# Patient Record
Sex: Female | Born: 1990 | Race: Black or African American | Hispanic: No | Marital: Single | Smoking: Never smoker
Health system: Southern US, Community
[De-identification: ages and names within clinical notes are randomized; demographics above are authoritative.]

## PROBLEM LIST (undated history)

## (undated) DIAGNOSIS — Z789 Other specified health status: Secondary | ICD-10-CM

## (undated) DIAGNOSIS — E559 Vitamin D deficiency, unspecified: Secondary | ICD-10-CM

## (undated) DIAGNOSIS — J039 Acute tonsillitis, unspecified: Secondary | ICD-10-CM

## (undated) DIAGNOSIS — E669 Obesity, unspecified: Secondary | ICD-10-CM

## (undated) DIAGNOSIS — Z803 Family history of malignant neoplasm of breast: Secondary | ICD-10-CM

## (undated) HISTORY — PX: NO PAST SURGERIES: SHX2092

## (undated) HISTORY — DX: Family history of malignant neoplasm of breast: Z80.3

## (undated) HISTORY — DX: Vitamin D deficiency, unspecified: E55.9

## (undated) HISTORY — DX: Obesity, unspecified: E66.9

## (undated) HISTORY — DX: Acute tonsillitis, unspecified: J03.90

---

## 2012-05-23 ENCOUNTER — Emergency Department: Payer: Self-pay | Admitting: Emergency Medicine

## 2012-05-23 LAB — PREGNANCY, URINE: Pregnancy Test, Urine: NEGATIVE m[IU]/mL
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
## 2014-09-25 ENCOUNTER — Inpatient Hospital Stay: Payer: Self-pay

## 2014-09-25 LAB — CBC WITH DIFFERENTIAL/PLATELET
Basophil #: 0.1 10*3/uL (ref 0.0–0.1)
Basophil %: 0.7 %
EOS ABS: 0.1 10*3/uL (ref 0.0–0.7)
Eosinophil %: 1.5 %
HCT: 38 % (ref 35.0–47.0)
HGB: 12.5 g/dL (ref 12.0–16.0)
Lymphocyte #: 1.9 10*3/uL (ref 1.0–3.6)
Lymphocyte %: 21.2 %
MCH: 26.1 pg (ref 26.0–34.0)
MCHC: 32.8 g/dL (ref 32.0–36.0)
MCV: 79 fL — ABNORMAL LOW (ref 80–100)
MONOS PCT: 4.9 %
Monocyte #: 0.4 x10 3/mm (ref 0.2–0.9)
NEUTROS ABS: 6.6 10*3/uL — AB (ref 1.4–6.5)
Neutrophil %: 71.7 %
PLATELETS: 231 10*3/uL (ref 150–440)
RBC: 4.79 10*6/uL (ref 3.80–5.20)
RDW: 13.8 % (ref 11.5–14.5)
WBC: 9.2 10*3/uL (ref 3.6–11.0)

## 2014-09-26 LAB — GC/CHLAMYDIA PROBE AMP

## 2014-09-27 LAB — HEMATOCRIT: HCT: 33.2 % — ABNORMAL LOW (ref 35.0–47.0)

## 2015-04-19 NOTE — H&P (Signed)
L&D Evaluation:  History Expanded:  HPI 24 yo G1 at 7686w6d gestational age by 173w4d gestational age.  Her pregnancy has been complicated by BMI 34 at initiation of care and diagnosis of trichomonas in March.  She presents with regular uterine contractions since 3pm today. She notes positive fetal movement, no leakage of fluid and no VB.   Gravida 1   Blood Type (Maternal) O positive   Group B Strep Results Maternal (Result >5wks must be treated as unknown) negative   Maternal HIV Negative   Maternal Syphilis Ab Nonreactive   Maternal Varicella Immune   Rubella Results (Maternal) immune   [facility]   Patient's Medical History No Chronic Illness  Obesity   Patient's Surgical History none   Medications Pre Natal Vitamins   Allergies NKDA   Social History none   Family History Non-Contributory   ROS:  ROS All systems were reviewed.  HEENT, CNS, GI, GU, Respiratory, CV, Renal and Musculoskeletal systems were found to be normal.   Exam:  Vital Signs T98.2, BPs 130s/80s, P70s, RR 18   General no apparent distress   Mental Status clear   Chest clear   Heart normal sinus rhythm   Abdomen gravid, non-tender   Estimated Fetal Weight 7 pounds   Fetal Position cephalic   Back no CVAT   Edema no edema   Pelvic 3.4 -> 5cm per RN   Mebranes Intact   FHT normal rate with no decels   FHT Description 130/mod var/+accels/no decels   Ucx not tracing well (3-4 q 10 min)   Skin no lesions   Lymph no lymphadenopathy   Impression:  Impression active labor, 1) Intrauterine pregnancy at 3786w6d gestational age, 2) labor, 3) obesity w/ BMI in 35   Plan:  Comments 1) Labor: expectant management  2) Fetus - category I tracing  3) PNL O positive / ABSC negative / RI / VZI / HIV neg / RPR NR / HBsAg neg / declines genetic screen / 1-hr OGTT 86 (early), 118 (28 weeks) / GBS negative  4) TDAP given 08/02/14  5) Disposition - home postpartum   Electronic  Signatures: Conard NovakJackson, Naveya Ellerman D (MD)  (Signed 17-Oct-15 20:26)  Authored: L&D Evaluation   Last Updated: 17-Oct-15 20:26 by Conard NovakJackson, Joylyn Duggin D (MD)

## 2017-03-28 ENCOUNTER — Ambulatory Visit: Payer: Self-pay | Admitting: Obstetrics and Gynecology

## 2017-11-30 ENCOUNTER — Emergency Department
Admission: EM | Admit: 2017-11-30 | Discharge: 2017-11-30 | Disposition: A | Discharge status: Home / Self Care | Payer: BLUE CROSS/BLUE SHIELD | Attending: Emergency Medicine | Admitting: Emergency Medicine

## 2017-11-30 ENCOUNTER — Encounter: Payer: Self-pay | Admitting: Emergency Medicine

## 2017-11-30 ENCOUNTER — Other Ambulatory Visit: Payer: Self-pay

## 2017-11-30 DIAGNOSIS — R220 Localized swelling, mass and lump, head: Secondary | ICD-10-CM | POA: Insufficient documentation

## 2017-11-30 DIAGNOSIS — Z3A01 Less than 8 weeks gestation of pregnancy: Secondary | ICD-10-CM | POA: Diagnosis not present

## 2017-11-30 DIAGNOSIS — K0500 Acute gingivitis, plaque induced: Secondary | ICD-10-CM | POA: Diagnosis not present

## 2017-11-30 DIAGNOSIS — O9989 Other specified diseases and conditions complicating pregnancy, childbirth and the puerperium: Secondary | ICD-10-CM | POA: Insufficient documentation

## 2017-11-30 LAB — CBC WITH DIFFERENTIAL/PLATELET
BASOS PCT: 1 %
Basophils Absolute: 0.1 10*3/uL (ref 0–0.1)
Eosinophils Absolute: 0.2 10*3/uL (ref 0–0.7)
Eosinophils Relative: 3 %
HCT: 40.7 % (ref 35.0–47.0)
Hemoglobin: 13.6 g/dL (ref 12.0–16.0)
LYMPHS ABS: 2.3 10*3/uL (ref 1.0–3.6)
Lymphocytes Relative: 28 %
MCH: 26.3 pg (ref 26.0–34.0)
MCHC: 33.4 g/dL (ref 32.0–36.0)
MCV: 78.9 fL — ABNORMAL LOW (ref 80.0–100.0)
Monocytes Absolute: 0.8 10*3/uL (ref 0.2–0.9)
Monocytes Relative: 11 %
NEUTROS PCT: 57 %
Neutro Abs: 4.6 10*3/uL (ref 1.4–6.5)
Platelets: 296 10*3/uL (ref 150–440)
RBC: 5.16 MIL/uL (ref 3.80–5.20)
RDW: 13.9 % (ref 11.5–14.5)
WBC: 8 10*3/uL (ref 3.6–11.0)

## 2017-11-30 MED ORDER — AMOXICILLIN 400 MG/5ML PO SUSR: 800.0000 mg | Freq: Two times a day (BID) | ORAL | 0 refills | Status: DC

## 2017-11-30 NOTE — ED Notes (Signed)

## 2017-11-30 NOTE — ED Provider Notes (Signed)
[facility] Emergency Department Provider Note  ____________________________________________   First MD Initiated Contact with Patient 11/30/17 1441     (approximate)  I have reviewed the triage vital signs and the nursing notes.   HISTORY  Chief Complaint Lymphadenopathy   HPI Michelle Bates is a 26 y.o. female is here with complaint of swelling inside her mouth for the last 4 days. Patient is unaware of any fever or chills. She denies any nausea or vomiting. Patient states she has had problems with her teeth in the past. Currently she is [redacted] weeks pregnant. She denies any issues with her pregnancy.she rates her discomfort as 6 out of 10.   History reviewed. No pertinent past medical history.  There are no active problems to display for this patient.   History reviewed. No pertinent surgical history.  Prior to Admission medications   Medication Sig Start Date End Date Taking? Authorizing Provider  amoxicillin (AMOXIL) 400 MG/5ML suspension Take 10 mLs (800 mg total) by mouth 2 (two) times daily. 11/30/17   Tommi RumpsSummers, Fabian Walder L, PA-C    Allergies Patient has no known allergies.  No family history on file.  Social History Social History   Tobacco Use  . Smoking status: Not on file  Substance Use Topics  . Alcohol use: Not on file  . Drug use: Not on file    Review of Systems Constitutional: No fever/chills Eyes: No visual changes. ENT: positive mouth pain. Positive dental pain. Cardiovascular: Denies chest pain. Respiratory: Denies shortness of breath. Gastrointestinal: No abdominal pain.  No nausea, no vomiting.  No diarrhea.   Musculoskeletal: Negative for back pain. Neurological: Negative for headaches, focal weakness or numbness. ____________________________________________   PHYSICAL EXAM:  VITAL SIGNS: ED Triage Vitals [11/30/17 1342]  Enc Vitals Group     BP 106/70     Pulse Rate 90     Resp 20     Temp 99 F (37.2 C)     Temp Source Oral     SpO2 100 %     Weight 200 lb (90.7 kg)     Height 5\' 2"  (1.575 m)     Head Circumference      Peak Flow      Pain Score 6     Pain Loc      Pain Edu?      Excl. in GC?    Constitutional: Alert and oriented. Well appearing and in no acute distress. Eyes: Conjunctivae are normal.  Head: Atraumatic. Nose: No congestion/rhinnorhea. Mouth/Throat: Mucous membranes are moist.  Oropharynx non-erythematous. Bilateral upper molars and premolars are tender to palpation. There is some irritation of the hard palate. No lesions are noted under the tongue. Uvula is midline. No erythema or exudate is noted. There is no obvious abscess or drainage noted. Neck: No stridor.   Hematological/Lymphatic/Immunilogical: No cervical lymphadenopathy. Cardiovascular: Normal rate, regular rhythm. Grossly normal heart sounds.  Good peripheral circulation. Respiratory: Normal respiratory effort.  No retractions. Lungs CTAB. Musculoskeletal: moves upper and lower extremities without any difficulty. Normal gait was noted. Neurologic:  Normal speech and language. No gross focal neurologic deficits are appreciated. No gait instability. Skin:  Skin is warm, dry and intact. No rash noted. Psychiatric: Mood and affect are normal. Speech and behavior are normal.  ____________________________________________   LABS (all labs ordered are listed, but only abnormal results are displayed)  Labs Reviewed  CBC WITH DIFFERENTIAL/PLATELET - Abnormal; Notable for the following components:  Result Value   MCV 78.9 (*)    All other components within normal limits    ____________________________________________   PROCEDURES  Procedure(s) performed: None  Procedures  Critical Care performed: No  ____________________________________________   INITIAL IMPRESSION / ASSESSMENT AND PLAN / ED COURSE Patient was given amoxicillin 400 mg 2 teaspoons twice a day as she states she prefers liquid.  Patient will continue with liquids and soft foods at this time. She will contact her dentist and OB/GYN if any continued problems. ____________________________________________   FINAL CLINICAL IMPRESSION(S) / ED DIAGNOSES  Final diagnoses:  Gingivitis, acute     ED Discharge Orders        Ordered    amoxicillin (AMOXIL) 400 MG/5ML suspension  2 times daily     11/30/17 1529       Note:  This document was prepared using Dragon voice recognition software and may include unintentional dictation errors.    Tommi RumpsSummers, Levia Waltermire L, PA-C 11/30/17 1540    Schaevitz, Myra Rudeavid Matthew, MD 12/01/17 1435

## 2017-11-30 NOTE — Discharge Instructions (Signed)
Eat soft foods and increase fluids.  Begin taking antibiotic twice a day as directed. Avoid salty or spicy foods at this time. Return to the emergency department if any severe worsening of her symptoms.

## 2017-11-30 NOTE — ED Triage Notes (Addendum)
Submandibular gland swelling noted x 4 days. States is [redacted] weeks pregnant with no symptoms related to pregnancy.

## 2017-12-10 NOTE — L&D Delivery Note (Addendum)
Date of delivery: 12/03/2018 Estimated Date of Delivery: 12/03/18 Patient's last menstrual period was 02/26/2018 (lmp unknown). EGA: 10230w0d  Delivery Note At 3:37 PM a viable female was delivered via Vaginal, Spontaneous (Presentation: OA;  LOA).   APGAR: 8, 9; Weight: 2960 g 6 pounds 8 ounces     Placenta status: spontaneous, intact.  Cord:  with the following complications: none.  Cord pH: NA  Mom pushed to deliver a viable female infant.  The head followed by tight shoulders, anterior shoulder delivered first after manipulation of posterior shoulder, and the rest of the body using gentle traction.  No nuchal cord noted.  Baby to mom's chest.  Cord clamped and cut after 3 min delay.  No cord blood obtained.  Placenta delivered spontaneously, intact, with a 3-vessel cord.  Perineum intact, no other lacerations.  All counts correct.  Hemostasis obtained with IV pitocin and fundal massage.     Anesthesia: epidural  Episiotomy: none Lacerations: none  Suture Repair: NA Est. Blood Loss (mL): 175  Mom to postpartum.  Baby to Couplet care / Skin to Skin.  Baby girl Dionisio PaschalOlivia Londyn Breastfeeding  Tresea MallJane Crislyn Willbanks, CNM 12/03/2018, 4:23 PM

## 2017-12-13 ENCOUNTER — Encounter: Payer: Self-pay | Admitting: Obstetrics and Gynecology

## 2017-12-13 ENCOUNTER — Ambulatory Visit (INDEPENDENT_AMBULATORY_CARE_PROVIDER_SITE_OTHER): Payer: BLUE CROSS/BLUE SHIELD | Admitting: Obstetrics and Gynecology

## 2017-12-13 VITALS — BP 112/58 | Ht 64.0 in | Wt 204.0 lb

## 2017-12-13 DIAGNOSIS — O99210 Obesity complicating pregnancy, unspecified trimester: Secondary | ICD-10-CM | POA: Insufficient documentation

## 2017-12-13 DIAGNOSIS — N926 Irregular menstruation, unspecified: Secondary | ICD-10-CM

## 2017-12-13 DIAGNOSIS — O0991 Supervision of high risk pregnancy, unspecified, first trimester: Secondary | ICD-10-CM

## 2017-12-13 DIAGNOSIS — O219 Vomiting of pregnancy, unspecified: Secondary | ICD-10-CM | POA: Insufficient documentation

## 2017-12-13 MED ORDER — DOXYLAMINE SUCCINATE (SLEEP) 25 MG PO TABS: 25.0000 mg | ORAL_TABLET | Freq: Every day | ORAL | 2 refills | Status: DC

## 2017-12-13 MED ORDER — PYRIDOXINE HCL 25 MG PO TABS: 25.0000 mg | ORAL_TABLET | Freq: Four times a day (QID) | ORAL | 3 refills | Status: DC

## 2017-12-13 NOTE — Progress Notes (Signed)
12/13/2017   Chief Complaint: Missed period  Transfer of Care Patient: no  History of Present Illness: Michelle Bates is a 27 y.o. G2P1001 [redacted]w[redacted]d based on Patient's last menstrual period was 10/14/2017 (exact date). with an Estimated Date of Delivery: 07/21/18, with the above CC.   NOB today. Pt c/o soreness in vaginal area and lower abdominal cramping/pain. Also c/o nausea, no vomiting. Positive home UPT.   Patient desires to have a tubal ligation this pregnancy, discussed with the patient that most people younger than 30 at the time of a tubal regret the decision. Asked her to consider if she would want another child if she was in a different relationship in the future, or if one of her children passed away would she want another.  Discussed option for salpingectomy.   Her periods were: regular periods every 28 days She was using no method when she conceived.  She has Negative signs or symptoms of nausea/vomiting of pregnancy. She has Negative signs or symptoms of miscarriage or preterm labor She identifies Negative Zika risk factors for her and her partner On any different medications around the time she conceived/early pregnancy: No  History of varicella: No   ROS: A 12-point review of systems was performed and negative, except as stated in the above HPI.  OBGYN History: As per HPI. OB History  Gravida Para Term Preterm AB Living  2 1 1     1   SAB TAB Ectopic Multiple Live Births          1    # Outcome Date GA Lbr Len/2nd Weight Sex Delivery Anes PTL Lv  2 Current           1 Term 09/26/14   6 lb 4 oz (2.835 kg) M Vag-Spont   LIV      Any issues with any prior pregnancies: no Any prior children are healthy, doing well, without any problems or issues: yes History of pap smears: Yes. Last pap smear 2015, NIL History of STIs: Yes, but unsure what.   Past Medical History: Past Medical History:  Diagnosis Date  . Obesity   . Tonsillitis     Past Surgical History: Past  Surgical History:  Procedure Laterality Date  . NO PAST SURGERIES      Family History:  Family History  Problem Relation Age of Onset  . Colon cancer Mother 75  . Brain cancer Father 36  . Breast cancer Maternal Aunt 40   She denies bleeding or blood clotting disorders.  She denies any history of mental retardation, birth defects or genetic disorders in her or the FOB's history  Social History:  Social History   Socioeconomic History  . Marital status: Single    Spouse name: Not on file  . Number of children: Not on file  . Years of education: Not on file  . Highest education level: Not on file  Social Needs  . Financial resource strain: Not on file  . Food insecurity - worry: Not on file  . Food insecurity - inability: Not on file  . Transportation needs - medical: Not on file  . Transportation needs - non-medical: Not on file  Occupational History  . Not on file  Tobacco Use  . Smoking status: Never Smoker  . Smokeless tobacco: Never Used  Substance and Sexual Activity  . Alcohol use: No    Frequency: Never  . Drug use: No  . Sexual activity: Yes    Birth control/protection: None  Other Topics Concern  . Not on file  Social History Narrative  . Not on file   Any pets in the household: no  Allergy: No Known Allergies  Current Outpatient Medications:  Current Outpatient Medications:  .  doxylamine, Sleep, (UNISOM) 25 MG tablet, Take 1 tablet (25 mg total) by mouth at bedtime., Disp: 30 tablet, Rfl: 2 .  pyridOXINE (VITAMIN B-6) 25 MG tablet, Take 1 tablet (25 mg total) by mouth QID., Disp: 120 tablet, Rfl: 3   Physical Exam:   BP (!) 112/58   Ht 5\' 4"  (1.626 m)   Wt 204 lb (92.5 kg)   LMP 10/14/2017 (Exact Date)   BMI 35.02 kg/m  Body mass index is 35.02 kg/m. Constitutional: Well nourished, well developed female in no acute distress.  Neck:  Supple, normal appearance, and no thyromegaly  Cardiovascular: S1, S2 normal, no murmur, rub or gallop,  regular rate and rhythm Respiratory:  Clear to auscultation bilateral. Normal respiratory effort Abdomen: positive bowel sounds and no masses, hernias; diffusely non tender to palpation, non distended Breasts: breasts appear normal, no suspicious masses, no skin or nipple changes or axillary nodes. Neuro/Psych:  Normal mood and affect.  Skin:  Warm and dry.  Lymphatic:  No inguinal lymphadenopathy.   Pelvic exam: is limited by body habitus EGBUS: within normal limits, Vagina: within normal limits and with no blood in the vault, Cervix: normal appearing cervix without discharge or lesions, closed/long/high, Uterus:  nonenlarged, and Adnexa:  normal adnexa and no mass, fullness, tenderness  Bedside US showed Gestational Sac in uterus, no clear yolk sac or fetal pole.   Assessment: Michelle Bates is a 27 y.o. G2P1001 2488w4d based on Patient's last menstrual period was 10/14/2017 (exact date). with an Estimated Date of Delivery: 07/21/18,  for prenatal care.  Plan:  1) Avoid alcoholic beverages. 2) Patient encouraged not to smoke.  3) Discontinue the use of all non-medicinal drugs and chemicals.  4) Take prenatal vitamins daily.  5) Seatbelt use advised 6) Nutrition, food safety (fish, cheese advisories, and high nitrite foods) and exercise discussed. 7) Hospital and [facility] discussed  8) Patient is asked about travel to areas at risk for the Zika virus, and counseled to avoid travel and exposure to mosquitoes or sexual partners who may have themselves been exposed to the virus. Testing is discussed, and will be ordered as appropriate.  9) [facility] advised 10) Genetic Screening, such as with 1st Trimester Screening, cell free fetal DNA, AFP testing, and Ultrasound, as well as with amniocentesis and CVS as appropriate, is discussed with patient. She plans to have genetic testing this pregnancy. Sickle cell screen in last pregnancy was negative. 11) Pap  today 12) Follow up for dating US in 2 weeks 13) B6 and unisom prescribed for nausea 14) Declined flu vaccination 15) offered patient genetic screening because of family history, she excepted. Labs sent. 16) Problem list reviewed and updated.   Adelene Idlerhristanna Yahaira Bruski MD [facility], [facility] 12/13/2017  6:35 PM

## 2017-12-14 LAB — RPR+RH+ABO+RUB AB+AB SCR+CB...
ANTIBODY SCREEN: NEGATIVE
HIV Screen 4th Generation wRfx: NONREACTIVE
Hematocrit: 35.6 % (ref 34.0–46.6)
Hemoglobin: 11.6 g/dL (ref 11.1–15.9)
Hepatitis B Surface Ag: NEGATIVE
MCH: 25.6 pg — AB (ref 26.6–33.0)
MCHC: 32.6 g/dL (ref 31.5–35.7)
MCV: 78 fL — ABNORMAL LOW (ref 79–97)
PLATELETS: 318 10*3/uL (ref 150–379)
RBC: 4.54 x10E6/uL (ref 3.77–5.28)
RDW: 14.6 % (ref 12.3–15.4)
RPR Ser Ql: NONREACTIVE
Rh Factor: POSITIVE
Rubella Antibodies, IGG: 2.26 index (ref >0.99)
Varicella zoster IgG: 442 index (ref >165)
WBC: 6.7 10*3/uL (ref 3.4–10.8)

## 2017-12-14 LAB — URINE CULTURE

## 2017-12-14 LAB — URINE DRUG PANEL 7
Amphetamines, Urine: NEGATIVE ng/mL
BENZODIAZEPINE QUANT UR: NEGATIVE ng/mL
Barbiturate Quant, Ur: NEGATIVE ng/mL
Cannabinoid Quant, Ur: NEGATIVE ng/mL
Cocaine (Metab.): NEGATIVE ng/mL
Opiate Quant, Ur: NEGATIVE ng/mL
PCP QUANT UR: NEGATIVE ng/mL

## 2017-12-18 LAB — PAPIG, CTNGTV, HPV, RFX 16/18
CHLAMYDIA, NUC. ACID AMP: NEGATIVE
Gonococcus, Nuc. Acid Amp: NEGATIVE
HPV, HIGH-RISK: NEGATIVE
PAP Smear Comment: 0
Trich vag by NAA: POSITIVE — AB

## 2017-12-24 ENCOUNTER — Encounter: Payer: Self-pay | Admitting: Obstetrics and Gynecology

## 2017-12-24 ENCOUNTER — Other Ambulatory Visit (INDEPENDENT_AMBULATORY_CARE_PROVIDER_SITE_OTHER): Payer: BLUE CROSS/BLUE SHIELD

## 2017-12-24 ENCOUNTER — Ambulatory Visit (INDEPENDENT_AMBULATORY_CARE_PROVIDER_SITE_OTHER): Payer: BLUE CROSS/BLUE SHIELD | Admitting: Obstetrics and Gynecology

## 2017-12-24 ENCOUNTER — Encounter: Payer: BLUE CROSS/BLUE SHIELD | Admitting: Maternal Newborn

## 2017-12-24 VITALS — BP 118/74 | Wt 208.0 lb

## 2017-12-24 DIAGNOSIS — O200 Threatened abortion: Secondary | ICD-10-CM | POA: Diagnosis not present

## 2017-12-24 NOTE — Progress Notes (Signed)
Obstetrics & Gynecology Office Visit   Chief Complaint  Patient presents with  . Vaginal Bleeding    in early pregnancy   History of Present Illness: 27 y.o. G2P1001 female at [redacted]w[redacted]d (unconfirmed and likely to be earlier). She states that she has been spotting during the month of January. However, yesterday the spotting became heavier to where she noted it at a time different than when she was going to the bathroom (normally just a spot on the tissue when she wiped while voiding).  She has mild cramping. She is scheduled for an ultrasound on 12/30/17 for dating/viability.   Her last bedside u/s was on 1/4. No inciting events. Denies fevers, chills, abdominal pain.   Blood type is O+.    Past Medical History:  Diagnosis Date  . Obesity   . Tonsillitis     Past Surgical History:  Procedure Laterality Date  . NO PAST SURGERIES      Gynecologic History: Patient's last menstrual period was 10/14/2017 (exact date).  Obstetric History: G2P1001  Family History  Problem Relation Age of Onset  . Colon cancer Mother 20  . Brain cancer Father 57  . Breast cancer Maternal Aunt 17    Social History   Socioeconomic History  . Marital status: Single    Spouse name: Not on file  . Number of children: Not on file  . Years of education: Not on file  . Highest education level: Not on file  Social Needs  . Financial resource strain: Not on file  . Food insecurity - worry: Not on file  . Food insecurity - inability: Not on file  . Transportation needs - medical: Not on file  . Transportation needs - non-medical: Not on file  Occupational History  . Not on file  Tobacco Use  . Smoking status: Never Smoker  . Smokeless tobacco: Never Used  Substance and Sexual Activity  . Alcohol use: No    Frequency: Never  . Drug use: No  . Sexual activity: Yes    Birth control/protection: None  Other Topics Concern  . Not on file  Social History Narrative  . Not on file    No Known  Allergies  Prior to Admission medications   Medication Sig Start Date End Date Taking? Authorizing Provider  doxylamine, Sleep, (UNISOM) 25 MG tablet Take 1 tablet (25 mg total) by mouth at bedtime. 12/13/17   Natale Milch, MD  pyridOXINE (VITAMIN B-6) 25 MG tablet Take 1 tablet (25 mg total) by mouth QID. 12/13/17   Schuman, Jaquelyn Bitter, MD    Review of Systems  Constitutional: Negative.   HENT: Negative.   Eyes: Negative.   Respiratory: Negative.   Cardiovascular: Negative.   Gastrointestinal: Negative.   Genitourinary: Negative.        See HPI for gyn-related ROS  Musculoskeletal: Negative.   Skin: Negative.   Neurological: Negative.   Psychiatric/Behavioral: Negative.      Physical Exam BP 118/74   Wt 208 lb (94.3 kg)   LMP 10/14/2017 (Exact Date)   BMI 35.70 kg/m  Patient's last menstrual period was 10/14/2017 (exact date). Physical Exam  Constitutional: She is oriented to person, place, and time. She appears well-developed and well-nourished. No distress.  Genitourinary: Uterus normal. Pelvic exam was performed with patient supine. There is no rash, tenderness, lesion or injury on the right labia. There is no rash, tenderness, lesion or injury on the left labia. There is bleeding (trace amount of dark red blood in  vaginal vault that is coming from the cervix. the OS is closed) in the vagina. No erythema or tenderness in the vagina. No signs of injury around the vagina. No vaginal discharge found. Right adnexum does not display mass, does not display tenderness and does not display fullness. Left adnexum does not display mass, does not display tenderness and does not display fullness. Cervix does not exhibit motion tenderness, lesion, discharge or polyp.   Uterus is mobile and anteverted. Uterus is not enlarged, tender or exhibiting a mass.  HENT:  Head: Normocephalic and atraumatic.  Eyes: EOM are normal. No scleral icterus.  Neck: Normal range of motion. Neck supple.   Cardiovascular: Normal rate and regular rhythm. Exam reveals no gallop and no friction rub.  No murmur heard. Pulmonary/Chest: Effort normal and breath sounds normal. No respiratory distress. She has no wheezes. She has no rales. Right breast exhibits no inverted nipple, no mass, no nipple discharge, no skin change and no tenderness. Left breast exhibits no inverted nipple, no mass, no nipple discharge, no skin change and no tenderness.  Abdominal: Soft. Bowel sounds are normal. She exhibits no distension and no mass. There is no tenderness. There is no rebound and no guarding.  Musculoskeletal: Normal range of motion. She exhibits no edema or tenderness.  Lymphadenopathy:       Right: No inguinal adenopathy present.       Left: No inguinal adenopathy present.  Neurological: She is alert and oriented to person, place, and time. No cranial nerve deficit.  Skin: Skin is warm and dry. No rash noted. No erythema.  Psychiatric: She has a normal mood and affect. Her behavior is normal. Judgment normal.    Female chaperone present for pelvic and breast  portions of the physical exam  U/S shows single IUP with likely fetal pole with measurements consistent with 5918w6d GA, no cardiac activity noted.  No SCH. See u/s report for details. I personally reviewed the ultrasound images and agree with the filed report.  Assessment: 27 y.o. 1052P1001 female here for  1. Threatened abortion      Plan: Problem List Items Addressed This Visit      Other   Threatened abortion - Primary   Relevant Orders   US OB Transvaginal    reassured the patient overall today, though the timing of what has been seen on ultrasound is drastically different than the timing suggested by her pregnancy tests at home (first positive was early December). However, a bed side u/s 11 days ago showed an empty gestational sac. Today there is a fetal pole without an obvious yolk sac. Per ACOG PB 200, after 14 days we should see fetal  cardiac activity. So, will await her appt on 1/21 for confirmation.  I did discuss that this is possibly a miscarriage. However, she may have a normally-developing pregnancy.  Bleeding precautions given (>2 pads per hour for 2 hours).   Thomasene MohairStephen Blakelee Allington, MD 12/24/2017 12:36 PM

## 2017-12-26 ENCOUNTER — Ambulatory Visit (INDEPENDENT_AMBULATORY_CARE_PROVIDER_SITE_OTHER): Payer: BLUE CROSS/BLUE SHIELD | Admitting: Obstetrics & Gynecology

## 2017-12-26 VITALS — BP 118/80 | Wt 208.0 lb

## 2017-12-26 DIAGNOSIS — O209 Hemorrhage in early pregnancy, unspecified: Secondary | ICD-10-CM | POA: Diagnosis not present

## 2017-12-26 NOTE — Progress Notes (Signed)
Obstetric Problem Visit   Chief Complaint:  Chief Complaint  Patient presents with  . Vaginal Bleeding   History of Present Illness: Patient is a 27 y.o. G2P1001 [redacted]w[redacted]d presenting for first trimester bleeding.  The onset of bleeding was worse yesterday but has been bleeding irreg and more like spotting for two weeks.  [place] 2 days ago shoed IUP CRL 6 weeks no FHT yet. Mild hip pain, no radiation.  No modifiers or other assoc sx's. Rh status: O+  PMHx: She  has a past medical history of Obesity and Tonsillitis. Also,  has a past surgical history that includes No past surgeries., family history includes Brain cancer (age of onset: 2) in her father; Breast cancer (age of onset: 36) in her maternal aunt; Colon cancer (age of onset: 21) in her mother.,  reports that  has never smoked. she has never used smokeless tobacco. She reports that she does not drink alcohol or use drugs.  She has a current medication list which includes the following prescription(s): doxylamine (sleep) and pyridoxine. Also, has No Known Allergies.  Review of Systems  Constitutional: Negative for chills, fever and malaise/fatigue.  HENT: Negative for congestion, sinus pain and sore throat.   Eyes: Negative for blurred vision and pain.  Respiratory: Negative for cough and wheezing.   Cardiovascular: Negative for chest pain and leg swelling.  Gastrointestinal: Negative for abdominal pain, constipation, diarrhea, heartburn, nausea and vomiting.  Genitourinary: Negative for dysuria, frequency, hematuria and urgency.  Musculoskeletal: Negative for back pain, joint pain, myalgias and neck pain.  Skin: Negative for itching and rash.  Neurological: Negative for dizziness, tremors and weakness.  Endo/Heme/Allergies: Does not bruise/bleed easily.  Psychiatric/Behavioral: Negative for depression. The patient is not nervous/anxious and does not have insomnia.    Objective: Vitals:   12/26/17 0820  BP: 118/80   Physical Exam    Constitutional: She is oriented to person, place, and time. She appears well-developed and well-nourished. No distress.  Genitourinary: Vagina normal and uterus normal. Pelvic exam was performed with patient supine. There is no rash, tenderness or lesion on the right labia. There is no rash, tenderness or lesion on the left labia. No erythema or bleeding in the vagina. Right adnexum does not display mass and does not display tenderness. Left adnexum does not display mass and does not display tenderness. Cervix does not exhibit motion tenderness, discharge, polyp or nabothian cyst.   Uterus is mobile and midaxial. Uterus is not enlarged or exhibiting a mass.  Abdominal: Soft. She exhibits no distension. There is no tenderness.  Musculoskeletal: Normal range of motion.  Neurological: She is alert and oriented to person, place, and time. No cranial nerve deficit.  Skin: Skin is warm and dry.  Psychiatric: She has a normal mood and affect.    Assessment: 27 y.o. G2P1001 [redacted]w[redacted]d 1. First trimester bleeding  1) First trimester bleeding - incidence and clinical course of first trimester bleeding is discussed in detail with the patient today.  Approximately 1/3 of pregnancies ending in live births experienced 1st trimester bleeding.  The amount of bleeding is variable and not necessarily predictive of outcome.  Sources may be cervical or uterine.  Subchorionic hemorrhages are a frequent concurrent findings on ultrasound and are followed expectantly.  These often absorb or regress spontaneously although risk for expansion and further disruption of the utero-placental interface leading to miscarriage is possible.  There is no clearly documented benefit to limiting or modifying activity and sexual intercourse in altering clinic course  of 1st trimester bleeding.    2) If not already done will proceed with TVUS evaluation to document viability, and if uncertain viability or absence of a demonstrable IUP (and no  previous documentation of IUP) will trend HCG levels.  3) The patient is Rh POS, rhogam is therefore not indicated to decrease the risk rhesus alloimmunization.    4) Routine bleeding precautions were discussed with the patient prior the conclusion of today's visit.  Annamarie MajorPaul Grazia Taffe, MD, Merlinda FrederickFACOG [facility], [facility] 12/26/2017  8:55 AM

## 2017-12-27 ENCOUNTER — Other Ambulatory Visit: Payer: Self-pay | Admitting: Obstetrics and Gynecology

## 2017-12-27 ENCOUNTER — Other Ambulatory Visit: Payer: Self-pay | Admitting: Obstetrics & Gynecology

## 2017-12-27 MED ORDER — METRONIDAZOLE 500 MG PO TABS: 500.0000 mg | ORAL_TABLET | Freq: Two times a day (BID) | ORAL | 0 refills | Status: AC

## 2017-12-27 NOTE — Progress Notes (Signed)
Attempted to call but no answer. Prescription for flagyl sent. Released to mychart.

## 2017-12-27 NOTE — Progress Notes (Signed)
Can you call this patient for me as well? She has trich, didn't answer my call. I sent prescription and a note to her mychart. Thank you!!

## 2017-12-30 ENCOUNTER — Ambulatory Visit (INDEPENDENT_AMBULATORY_CARE_PROVIDER_SITE_OTHER): Payer: BLUE CROSS/BLUE SHIELD | Admitting: Obstetrics and Gynecology

## 2017-12-30 ENCOUNTER — Ambulatory Visit (INDEPENDENT_AMBULATORY_CARE_PROVIDER_SITE_OTHER): Payer: BLUE CROSS/BLUE SHIELD

## 2017-12-30 ENCOUNTER — Encounter: Payer: Self-pay | Admitting: Obstetrics and Gynecology

## 2017-12-30 ENCOUNTER — Other Ambulatory Visit: Payer: Self-pay | Admitting: Obstetrics and Gynecology

## 2017-12-30 VITALS — BP 114/78 | Wt 206.0 lb

## 2017-12-30 DIAGNOSIS — Z362 Encounter for other antenatal screening follow-up: Secondary | ICD-10-CM | POA: Diagnosis not present

## 2017-12-30 DIAGNOSIS — O039 Complete or unspecified spontaneous abortion without complication: Secondary | ICD-10-CM

## 2017-12-30 DIAGNOSIS — O0991 Supervision of high risk pregnancy, unspecified, first trimester: Secondary | ICD-10-CM

## 2017-12-30 NOTE — Progress Notes (Signed)
Gynecology Ultrasound Follow Up  Chief Complaint: follow up for viability and dating of pregnancy   History of Present Illness: Patient is a 27 y.o. female who presents today for ultrasound evaluation of the above .  I have personally reviewed the images and report for this ultrasound and my interpretation is reflected below.  Ultrasound demonstrates the following findings Adnexa: no masses seen  Uterus: anterverted with endometrial stripe  Normal and thin Additional: no evidence of pregnancy.  Completed SAB  Denies bleeding and cramping  Past Medical History:  Diagnosis Date  . Obesity   . Tonsillitis     Past Surgical History:  Procedure Laterality Date  . NO PAST SURGERIES       Family History  Problem Relation Age of Onset  . Colon cancer Mother 15  . Brain cancer Father 39  . Breast cancer Maternal Aunt 15    Social History   Socioeconomic History  . Marital status: Single    Spouse name: Not on file  . Number of children: Not on file  . Years of education: Not on file  . Highest education level: Not on file  Social Needs  . Financial resource strain: Not on file  . Food insecurity - worry: Not on file  . Food insecurity - inability: Not on file  . Transportation needs - medical: Not on file  . Transportation needs - non-medical: Not on file  Occupational History  . Not on file  Tobacco Use  . Smoking status: Never Smoker  . Smokeless tobacco: Never Used  Substance and Sexual Activity  . Alcohol use: No    Frequency: Never  . Drug use: No  . Sexual activity: Yes    Birth control/protection: None  Other Topics Concern  . Not on file  Social History Narrative  . Not on file    No Known Allergies  Prior to Admission medications   Medication Sig Start Date End Date Taking? Authorizing Provider  doxylamine, Sleep, (UNISOM) 25 MG tablet Take 1 tablet (25 mg total) by mouth at bedtime. 12/13/17   Schuman, Jaquelyn Bitter, MD  metroNIDAZOLE (FLAGYL)  500 MG tablet Take 1 tablet (500 mg total) by mouth 2 (two) times daily for 7 days. 12/27/17 01/03/18  Natale Milch, MD  pyridOXINE (VITAMIN B-6) 25 MG tablet Take 1 tablet (25 mg total) by mouth QID. 12/13/17   Natale Milch, MD    Physical Exam BP 114/78   Wt 206 lb (93.4 kg)   LMP 10/14/2017 (Exact Date)   Breastfeeding? Unknown   BMI 35.36 kg/m    General: NAD HEENT: normocephalic, anicteric Pulmonary: No increased work of breathing Extremities: no edema, erythema, or tenderness Neurologic: Grossly intact, normal gait Psychiatric: mood appropriate, affect full   Assessment: 27 y.o. G2P1011 here with  1. Spontaneous abortion     Plan: Problem List Items Addressed This Visit    None    Visit Diagnoses    Spontaneous abortion    -  Primary     Counseled patient on diagnosis of completed SAB. Discussed that causes are generally unknown, but likely to be unrelated to anything she did.  Counseled her to wait a cycle or so, but no need other than for emotional reasons to delay another attempt at pregnancy. She plans to try again soon.   15 minutes spent in face to face discussion with > 50% spent in counseling, management, and coordination of care of her spontaneous abortion.   Jeannett Senior  Jean RosenthalJackson, MD 12/30/2017 9:27 AM

## 2018-05-12 ENCOUNTER — Ambulatory Visit (INDEPENDENT_AMBULATORY_CARE_PROVIDER_SITE_OTHER): Payer: Managed Care, Other (non HMO) | Admitting: Obstetrics and Gynecology

## 2018-05-12 ENCOUNTER — Encounter: Payer: Self-pay | Admitting: Obstetrics and Gynecology

## 2018-05-12 VITALS — BP 112/72 | HR 74 | Wt 201.0 lb

## 2018-05-12 DIAGNOSIS — Z3A10 10 weeks gestation of pregnancy: Secondary | ICD-10-CM

## 2018-05-12 DIAGNOSIS — Z3481 Encounter for supervision of other normal pregnancy, first trimester: Secondary | ICD-10-CM

## 2018-05-12 DIAGNOSIS — Z3201 Encounter for pregnancy test, result positive: Secondary | ICD-10-CM | POA: Diagnosis not present

## 2018-05-12 DIAGNOSIS — Z3480 Encounter for supervision of other normal pregnancy, unspecified trimester: Secondary | ICD-10-CM

## 2018-05-12 DIAGNOSIS — N926 Irregular menstruation, unspecified: Secondary | ICD-10-CM

## 2018-05-12 LAB — POCT URINE PREGNANCY: PREG TEST UR: POSITIVE — AB

## 2018-05-12 NOTE — Progress Notes (Signed)
NOB today. +UPT at home. Pt reports no problems. Recent miscarriage, February 2019. Denies n/v.

## 2018-05-12 NOTE — Progress Notes (Signed)
05/12/2018   Chief Complaint: Missed period  Transfer of Care Patient: no  History of Present Illness: Ms. Curet is a 27 y.o. G3P1011 [redacted]w[redacted]d based on Patient's last menstrual period was 02/26/2018 (lmp unknown). with an Estimated Date of Delivery: 12/03/18, with the above CC.   Her periods were: irregular period since her miscarriage, unsure of LMP. She was using no method when she conceived.  She has Negative signs or symptoms of nausea/vomiting of pregnancy. She has Negative signs or symptoms of miscarriage or preterm labor She identifies Negative Zika risk factors for her and her partner On any different medications around the time she conceived/early pregnancy: No  History of varicella: No   ROS: A 12-point review of systems was performed and negative, except as stated in the above HPI.  OBGYN History: As per HPI. OB History  Gravida Para Term Preterm AB Living  3 1 1   1 1   SAB TAB Ectopic Multiple Live Births  1       1    # Outcome Date GA Lbr Len/2nd Weight Sex Delivery Anes PTL Lv  3 Current           2 Term 09/26/14   6 lb 4 oz (2.835 kg) M Vag-Spont   LIV  1 SAB             Any issues with any prior pregnancies: no Any prior children are healthy, doing well, without any problems or issues: yes History of pap smears: Yes. Last pap smear 12/13/17 NIL. History of STIs: Yes   Past Medical History: Past Medical History:  Diagnosis Date  . Obesity   . Tonsillitis     Past Surgical History: Past Surgical History:  Procedure Laterality Date  . NO PAST SURGERIES      Family History:  Family History  Problem Relation Age of Onset  . Colon cancer Mother 43  . Brain cancer Father 69  . Prostate cancer Father 26  . Breast cancer Maternal Aunt 40   She reports any female cancers(BREAST) ,DENIES bleeding or blood clotting disorders.  She denies any history of mental retardation, birth defects or genetic disorders in her or the FOB's history  Social History:    Social drinking in May before she knew she was pregnant  Social History   Socioeconomic History  . Marital status: Single    Spouse name: Not on file  . Number of children: Not on file  . Years of education: Not on file  . Highest education level: Not on file  Occupational History  . Not on file  Social Needs  . Financial resource strain: Not on file  . Food insecurity:    Worry: Not on file    Inability: Not on file  . Transportation needs:    Medical: Not on file    Non-medical: Not on file  Tobacco Use  . Smoking status: Never Smoker  . Smokeless tobacco: Never Used  Substance and Sexual Activity  . Alcohol use: No    Frequency: Never  . Drug use: No  . Sexual activity: Yes    Birth control/protection: None  Lifestyle  . Physical activity:    Days per week: Not on file    Minutes per session: Not on file  . Stress: Not on file  Relationships  . Social connections:    Talks on phone: Not on file    Gets together: Not on file    Attends religious service: Not on  file    Active member of club or organization: Not on file    Attends meetings of clubs or organizations: Not on file    Relationship status: Not on file  . Intimate partner violence:    Fear of current or ex partner: Not on file    Emotionally abused: Not on file    Physically abused: Not on file    Forced sexual activity: Not on file  Other Topics Concern  . Not on file  Social History Narrative  . Not on file   Any pets in the household: no    Allergy: No Known Allergies  Current Outpatient Medications:  Current Outpatient Medications:  .  doxylamine, Sleep, (UNISOM) 25 MG tablet, Take 1 tablet (25 mg total) by mouth at bedtime. (Patient not taking: Reported on 05/12/2018), Disp: 30 tablet, Rfl: 2 .  pyridOXINE (VITAMIN B-6) 25 MG tablet, Take 1 tablet (25 mg total) by mouth QID. (Patient not taking: Reported on 05/12/2018), Disp: 120 tablet, Rfl: 3   Physical Exam:   BP 112/72   Pulse 74    Wt 201 lb (91.2 kg)   LMP 02/26/2018 (LMP Unknown)   BMI 34.50 kg/m  Body mass index is 34.5 kg/m. Constitutional: Well nourished, well developed female in no acute distress.  Neck:  Supple, normal appearance, and no thyromegaly  Cardiovascular: S1, S2 normal, no murmur, rub or gallop, regular rate and rhythm Respiratory:  Clear to auscultation bilateral. Normal respiratory effort Abdomen: positive bowel sounds and no masses, hernias; diffusely non tender to palpation, non distended Neuro/Psych:  Normal mood and affect.  Skin:  Warm and dry.  Lymphatic:  No inguinal lymphadenopathy.  Pelvic exam: deferred today  Bedside transvaginal US showed an IUP with FHR 150s, CRL equal to 10 weeks 5 days   Assessment: Ms. Excell SeltzerCooper is a 27 y.o. G3P1011 1022w5d based on Patient's last menstrual period was 02/26/2018 (lmp unknown). with an Estimated Date of Delivery: 12/03/18,  for prenatal care.  Plan:  1) Avoid alcoholic beverages. 2) Patient encouraged not to smoke.  3) Discontinue the use of all non-medicinal drugs and chemicals.  4) Take prenatal vitamins daily.  5) Seatbelt use advised 6) Nutrition, food safety (fish, cheese advisories, and high nitrite foods) and exercise discussed. 7) Hospital and practice style delivering at [facility] discussed  8) Patient is asked about travel to areas at risk for the Zika virus, and counseled to avoid travel and exposure to mosquitoes or sexual partners who may have themselves been exposed to the virus. Testing is discussed, and will be ordered as appropriate.  9) [facility] advised 10) Genetic Screening, such as with 1st Trimester Screening, cell free fetal DNA, AFP testing, and Ultrasound, as well as with amniocentesis and CVS as appropriate, is discussed with patient. She plans to have genetic testing this pregnancy, if she decides to keep the pregnancy. 11) Patient is considering is she plans to continue with the pregnancy or to have a  termination. She is not sure she can afford to care for another child. Will follow up for official dating US.Will need Prenatal labs collected if she wants to keep the pregnant.    Problem list reviewed and updated.  Adelene Idlerhristanna Stehanie Ekstrom MD [facility], [facility] 05/12/18 12:27 PM

## 2018-05-19 ENCOUNTER — Ambulatory Visit (INDEPENDENT_AMBULATORY_CARE_PROVIDER_SITE_OTHER): Payer: Managed Care, Other (non HMO)

## 2018-05-19 ENCOUNTER — Ambulatory Visit (INDEPENDENT_AMBULATORY_CARE_PROVIDER_SITE_OTHER): Payer: Managed Care, Other (non HMO) | Admitting: Obstetrics & Gynecology

## 2018-05-19 VITALS — BP 120/80 | Wt 203.0 lb

## 2018-05-19 DIAGNOSIS — Z3480 Encounter for supervision of other normal pregnancy, unspecified trimester: Secondary | ICD-10-CM

## 2018-05-19 DIAGNOSIS — Z3481 Encounter for supervision of other normal pregnancy, first trimester: Secondary | ICD-10-CM | POA: Diagnosis not present

## 2018-05-19 DIAGNOSIS — Z3A11 11 weeks gestation of pregnancy: Secondary | ICD-10-CM

## 2018-05-19 DIAGNOSIS — Z3491 Encounter for supervision of normal pregnancy, unspecified, first trimester: Secondary | ICD-10-CM

## 2018-05-19 NOTE — Progress Notes (Signed)
  Subjective  Fetal Movement? no Some nausea and breast T, mild. Leaking Fluid? no Vaginal Bleeding? no Desires continuation of pregnancy.  Objective  BP 120/80   Wt 203 lb (92.1 kg)   LMP 02/26/2018 (LMP Unknown)   BMI 34.84 kg/m  General: NAD Pumonary: no increased work of breathing Abdomen: gravid, non-tender Extremities: no edema Psychiatric: mood appropriate, affect full  Assessment  27 y.o. G3P1011 at 7751w5d by  12/03/2018, by Ultrasound presenting for routine prenatal visit  Plan   Problem List Items Addressed This Visit    None    Visit Diagnoses    [redacted] weeks gestation of pregnancy    -  Primary   Relevant Orders   RPR+Rh+ABO+Rub Ab+Ab Scr+CB.Marland Kitchen.Marland Kitchen.   Urine Culture   Drug Screen, Urine   MaterniT21 PLUS Core+SCA   NuSwab Vaginitis Plus (VG+)   Encounter for supervision of low-risk pregnancy in first trimester      Review of ULTRASOUND.    I have personally reviewed images and [facility]    Plan of management to be discussed with patient. Labs, cultures today  Desires BTL PP for contraception  Annamarie MajorPaul Mattix Imhof, MD, Merlinda FrederickFACOG [facility], [facility] 05/19/2018  11:19 AM

## 2018-05-19 NOTE — Patient Instructions (Signed)

## 2018-05-20 LAB — RPR+RH+ABO+RUB AB+AB SCR+CB...
Antibody Screen: NEGATIVE
HEMATOCRIT: 37.3 % (ref 34.0–46.6)
HEMOGLOBIN: 12.4 g/dL (ref 11.1–15.9)
HEP B S AG: NEGATIVE
HIV Screen 4th Generation wRfx: NONREACTIVE
MCH: 26.4 pg — ABNORMAL LOW (ref 26.6–33.0)
MCHC: 33.2 g/dL (ref 31.5–35.7)
MCV: 79 fL (ref 79–97)
Platelets: 248 10*3/uL (ref 150–450)
RBC: 4.7 x10E6/uL (ref 3.77–5.28)
RDW: 14.4 % (ref 12.3–15.4)
RH TYPE: POSITIVE
RPR: NONREACTIVE
Rubella Antibodies, IGG: 3.14 index (ref >0.99)
VARICELLA: 440 {index} (ref >165)
WBC: 5.9 10*3/uL (ref 3.4–10.8)

## 2018-05-23 ENCOUNTER — Telehealth: Payer: Self-pay

## 2018-05-23 NOTE — Telephone Encounter (Signed)
Pt called triage today asking for Dr.Schuman to prescribe her a double breast pump. Called pt and advised her that we do not rx them until about the 3rd trimester because insurance should cover it by then.

## 2018-05-24 LAB — MATERNIT21 PLUS CORE+SCA
Chromosome 13: NEGATIVE
Chromosome 18: NEGATIVE
Chromosome 21: NEGATIVE
Y CHROMOSOME: NOT DETECTED

## 2018-05-24 LAB — DRUG SCREEN, URINE
Amphetamines, Urine: NEGATIVE ng/mL
BARBITURATE SCREEN URINE: NEGATIVE ng/mL
Benzodiazepine Quant, Ur: NEGATIVE ng/mL
Cannabinoid Quant, Ur: NEGATIVE ng/mL
Cocaine (Metab.): NEGATIVE ng/mL
OPIATE QUANT UR: NEGATIVE ng/mL
PCP QUANT UR: NEGATIVE ng/mL

## 2018-05-24 LAB — NUSWAB VAGINITIS PLUS (VG+)
Atopobium vaginae: HIGH Score — AB
BVAB 2: HIGH Score — AB
CANDIDA ALBICANS, NAA: NEGATIVE
CHLAMYDIA TRACHOMATIS, NAA: NEGATIVE
Candida glabrata, NAA: NEGATIVE
Megasphaera 1: HIGH Score — AB
Neisseria gonorrhoeae, NAA: NEGATIVE
TRICH VAG BY NAA: NEGATIVE

## 2018-05-24 LAB — URINE CULTURE

## 2018-05-26 ENCOUNTER — Telehealth: Payer: Self-pay | Admitting: Obstetrics & Gynecology

## 2018-05-26 ENCOUNTER — Other Ambulatory Visit: Payer: Self-pay | Admitting: Obstetrics & Gynecology

## 2018-05-26 MED ORDER — METRONIDAZOLE 500 MG PO TABS: 500.0000 mg | ORAL_TABLET | Freq: Two times a day (BID) | ORAL | 0 refills | Status: DC

## 2018-05-26 NOTE — Progress Notes (Signed)
LM

## 2018-05-26 NOTE — Telephone Encounter (Signed)
Patient is calling for labs results. Please advise. 

## 2018-05-30 ENCOUNTER — Encounter: Payer: Self-pay | Admitting: Obstetrics and Gynecology

## 2018-06-16 ENCOUNTER — Ambulatory Visit (INDEPENDENT_AMBULATORY_CARE_PROVIDER_SITE_OTHER): Payer: Medicaid Other | Admitting: Obstetrics & Gynecology

## 2018-06-16 VITALS — BP 120/80 | Wt 206.0 lb

## 2018-06-16 DIAGNOSIS — Z3A15 15 weeks gestation of pregnancy: Secondary | ICD-10-CM

## 2018-06-16 DIAGNOSIS — O0992 Supervision of high risk pregnancy, unspecified, second trimester: Secondary | ICD-10-CM

## 2018-06-16 NOTE — Patient Instructions (Signed)

## 2018-06-16 NOTE — Progress Notes (Signed)
  Subjective  Fetal Movement? yes Contractions? no Leaking Fluid? no Vaginal Bleeding? no  Objective  BP 120/80   Wt 206 lb (93.4 kg)   LMP 02/26/2018 (LMP Unknown)   BMI 35.36 kg/m  General: NAD Pumonary: no increased work of breathing Abdomen: gravid, non-tender Extremities: no edema Psychiatric: mood appropriate, affect full  Assessment  27 y.o. G3P1011 at 5618w5d by  12/03/2018, by Ultrasound presenting for routine prenatal visit  Plan   Problem List Items Addressed This Visit    Supervision of high risk pregnancy in first trimester    Other Visit Diagnoses    [redacted] weeks gestation of pregnancy    -  Primary   Relevant Orders   US OB Comp + 14 Wk     Clinic [facility] Prenatal Labs  Dating US Blood type: O+     Genetic Screen   NIPS:  Normal XX    Antibody: Neg  Anatomic US Next visit Rubella:  Imm Varicella: Imm  GTT Early:        28 wk:      RPR:   NR  Rhogam na HBsAg:   Neg  TDaP vaccine     OFFER 28 WEEKS                  HIV:   Neg  Flu Shot    Declined                            GBS: at 36 weeks  Contraception Desires Tubal (needs to sign form) Pap:  Jan 2019 normal  CBB  Offer 28 weeks   CS/VBAC  Not applicable   Baby Food  Breast   Support Person      Annamarie MajorPaul Cookie Pore, MD, Merlinda FrederickFACOG [facility], [facility] 06/16/2018  3:10 PM

## 2018-07-14 ENCOUNTER — Encounter: Payer: Self-pay | Admitting: Obstetrics and Gynecology

## 2018-07-14 ENCOUNTER — Ambulatory Visit (INDEPENDENT_AMBULATORY_CARE_PROVIDER_SITE_OTHER): Payer: Medicaid Other | Admitting: Obstetrics and Gynecology

## 2018-07-14 ENCOUNTER — Ambulatory Visit (INDEPENDENT_AMBULATORY_CARE_PROVIDER_SITE_OTHER): Payer: 59

## 2018-07-14 VITALS — BP 120/80 | Wt 212.0 lb

## 2018-07-14 DIAGNOSIS — O0991 Supervision of high risk pregnancy, unspecified, first trimester: Secondary | ICD-10-CM

## 2018-07-14 DIAGNOSIS — Z3A15 15 weeks gestation of pregnancy: Secondary | ICD-10-CM

## 2018-07-14 DIAGNOSIS — Z363 Encounter for antenatal screening for malformations: Secondary | ICD-10-CM

## 2018-07-14 DIAGNOSIS — Z3A19 19 weeks gestation of pregnancy: Secondary | ICD-10-CM

## 2018-07-14 DIAGNOSIS — O0992 Supervision of high risk pregnancy, unspecified, second trimester: Secondary | ICD-10-CM

## 2018-07-14 DIAGNOSIS — O99210 Obesity complicating pregnancy, unspecified trimester: Secondary | ICD-10-CM

## 2018-07-14 NOTE — Progress Notes (Signed)
    Routine Prenatal Care Visit  Subjective  Michelle Bates is a 27 y.o. G3P1011 at 3480w5d being seen today for ongoing prenatal care.  She is currently monitored for the following issues for this high-risk pregnancy and does not have any active problems on file.  ----------------------------------------------------------------------------------- Patient reports no complaints.   Contractions: Not present. Vag. Bleeding: None.  Movement: Present. Denies leaking of fluid.  ----------------------------------------------------------------------------------- The following portions of the patient's history were reviewed and updated as appropriate: allergies, current medications, past family history, past medical history, past social history, past surgical history and problem list. Problem list updated.   Objective  Last menstrual period 02/26/2018, unknown if currently breastfeeding. Pregravid weight 201 lb (91.2 kg) Total Weight Gain 11 lb (4.99 kg)  Body mass index is 36.39 kg/m. Urinalysis: Urine Protein: Negative Urine Glucose: Negative  Fetal Status: Fetal Heart Rate (bpm): 144   Movement: Present     General:  Alert, oriented and cooperative. Patient is in no acute distress.  Skin: Skin is warm and dry. No rash noted.   Cardiovascular: Normal heart rate noted  Respiratory: Normal respiratory effort, no problems with respiration noted  Abdomen: Soft, gravid, appropriate for gestational age. Pain/Pressure: Present     Pelvic:  Cervical exam deferred        Extremities: Normal range of motion.     ental Status: Normal mood and affect. Normal behavior. Normal judgment and thought content.     Assessment   27 y.o. G3P1011 at 5780w5d by  12/03/2018, by Ultrasound presenting for routine prenatal visit  Plan     Clinic [facility] Prenatal Labs  Dating  10wk  US Blood type: O/Positive/-- (06/10 1121)   Genetic Screen   NIPS:  Normal XX    Antibody:Negative (06/10 1121)  Anatomic US   incomplete Rubella: 3.14 (06/10 1121) Varicella: Immune  GTT Early:        28 wk:      RPR: Non Reactive (06/10 1121)   Rhogam Not indicated HBsAg: Negative (06/10 1121)   TDaP vaccine                       HIV: Non Reactive (06/10 1121)   Flu Shot    Declined                            GBS:   Contraception Desires Tubal (needs to sign form) Pap: 2019 NIL  CBB     CS/VBAC  Not applicable   Baby Food  Breast   Support Person        Gestational age appropriate obstetric precautions including but not limited to vaginal bleeding, contractions, leaking of fluid and fetal movement were reviewed in detail with the patient.    Anatomy US incomplete, will need to repeat at her next visit. Given information about breast feeding classes and lactation consultation.  Return in about 1 month (around 08/11/2018) for ROB and US.  [name] MD [facility], [facility] 07/14/18 3:45 PM

## 2018-08-12 ENCOUNTER — Ambulatory Visit (INDEPENDENT_AMBULATORY_CARE_PROVIDER_SITE_OTHER): Payer: 59 | Admitting: Obstetrics and Gynecology

## 2018-08-12 ENCOUNTER — Ambulatory Visit (INDEPENDENT_AMBULATORY_CARE_PROVIDER_SITE_OTHER): Payer: 59

## 2018-08-12 VITALS — BP 104/54 | Wt 218.0 lb

## 2018-08-12 DIAGNOSIS — Z3A23 23 weeks gestation of pregnancy: Secondary | ICD-10-CM

## 2018-08-12 DIAGNOSIS — O0991 Supervision of high risk pregnancy, unspecified, first trimester: Secondary | ICD-10-CM

## 2018-08-12 DIAGNOSIS — IMO0002 Reserved for concepts with insufficient information to code with codable children: Secondary | ICD-10-CM

## 2018-08-12 DIAGNOSIS — Z3689 Encounter for other specified antenatal screening: Secondary | ICD-10-CM | POA: Diagnosis not present

## 2018-08-12 DIAGNOSIS — Z131 Encounter for screening for diabetes mellitus: Secondary | ICD-10-CM

## 2018-08-12 DIAGNOSIS — Z3480 Encounter for supervision of other normal pregnancy, unspecified trimester: Secondary | ICD-10-CM

## 2018-08-12 DIAGNOSIS — Z0489 Encounter for examination and observation for other specified reasons: Secondary | ICD-10-CM

## 2018-08-12 LAB — POCT URINALYSIS DIPSTICK OB
GLUCOSE, UA: NEGATIVE
POC,PROTEIN,UA: NEGATIVE

## 2018-08-12 NOTE — Progress Notes (Signed)
    Routine Prenatal Care Visit  Subjective  Michelle Bates is a 27 y.o. G3P1011 at [redacted]w[redacted]d being seen today for ongoing prenatal care.  She is currently monitored for the following issues for this low-risk pregnancy and has Supervision of other normal pregnancy, antepartum on their problem list.  ----------------------------------------------------------------------------------- Patient reports no complaints.   Contractions: Not present. Vag. Bleeding: None.  Movement: Present. Denies leaking of fluid.  ----------------------------------------------------------------------------------- The following portions of the patient's history were reviewed and updated as appropriate: allergies, current medications, past family history, past medical history, past social history, past surgical history and problem list. Problem list updated.   Objective  Blood pressure (!) 104/54, weight 218 lb (98.9 kg), last menstrual period 02/26/2018, unknown if currently breastfeeding. Pregravid weight 201 lb (91.2 kg) Total Weight Gain 17 lb (7.711 kg) Urinalysis:      Fetal Status: Fetal Heart Rate (bpm): 145 Fundal Height: 23 cm Movement: Present     General:  Alert, oriented and cooperative. Patient is in no acute distress.  Skin: Skin is warm and dry. No rash noted.   Cardiovascular: Normal heart rate noted  Respiratory: Normal respiratory effort, no problems with respiration noted  Abdomen: Soft, gravid, appropriate for gestational age. Pain/Pressure: Present     Pelvic:  Cervical exam deferred        Extremities: Normal range of motion.     ental Status: Normal mood and affect. Normal behavior. Normal judgment and thought content.   US Ob Follow Up  Result Date: 08/13/2018 ULTRASOUND REPORT Patient Name: Michelle Bates DOB: [birth year] MRN: [id number] Location: [facility] Date of Service: 08/12/2018 Indications: Anatomy follow up ultrasound Findings: Mason Jim intrauterine pregnancy is visualized with  FHR at 141 BPM. Fetal presentation is Cephalic. Placenta: Posterior, grade 0. AFI: subjectively normal. Anatomic survey is incomplete for cardiac views due to fetal position . Impression: 1. [redacted]w[redacted]d Viable Singleton Intrauterine pregnancy previously established criteria. 2. Normal Anatomy Scan is still incomplete for cardiac views. Recommendations: 1.Clinical correlation with the patient's History and Physical Exam. Willette Alma, RDMS, RVT There is a singleton gestation with subjectively normal amniotic fluid volume.  Limited evaluation of the fetal anatomy was performed today, focusing on on anatomic structures not fully visualized at the time of prior study.The visualized fetal anatomical survey appears within normal limits within the resolution of ultrasound as described above, and the anatomic survey  remains incomplete for cardiac views.  It must be noted that a normal ultrasound is unable to rule out fetal aneuploidy.  Vena Austria, MD, FACOG [facility], [facility]     Assessment   27 y.o. [phone] at [redacted]w[redacted]d by  12/03/2018, by Ultrasound presenting for routine prenatal visit  Plan   pregnancy #3 Problems (from 05/12/18 to present)    Problem Noted Resolved   Supervision of other normal pregnancy, antepartum 08/12/2018 by Vena Austria, MD No       Gestational age appropriate obstetric precautions including but not limited to vaginal bleeding, contractions, leaking of fluid and fetal movement were reviewed in detail with the patient.    - anatomy scan remains incomplete DP referral placed  Return in about 4 weeks (around 09/09/2018) for ROB and 28 week labs.  Vena Austria, MD, Evern Core [facility], [facility] 08/14/2018, 1:54 PM

## 2018-08-12 NOTE — Progress Notes (Signed)
ROB Ultrasound today Lower abdomen sharpe pain

## 2018-08-21 ENCOUNTER — Other Ambulatory Visit: Payer: Self-pay | Admitting: Obstetrics and Gynecology

## 2018-08-21 DIAGNOSIS — IMO0002 Reserved for concepts with insufficient information to code with codable children: Secondary | ICD-10-CM

## 2018-08-21 DIAGNOSIS — Z3480 Encounter for supervision of other normal pregnancy, unspecified trimester: Secondary | ICD-10-CM

## 2018-08-21 DIAGNOSIS — Z0489 Encounter for examination and observation for other specified reasons: Secondary | ICD-10-CM

## 2018-08-25 ENCOUNTER — Other Ambulatory Visit: Payer: Self-pay

## 2018-08-28 ENCOUNTER — Ambulatory Visit
Admission: RE | Admit: 2018-08-28 | Discharge: 2018-08-28 | Disposition: A | Discharge status: Home / Self Care | Payer: Medicaid Other | Source: Ambulatory Visit | Attending: Obstetrics and Gynecology | Admitting: Obstetrics and Gynecology

## 2018-08-28 ENCOUNTER — Ambulatory Visit: Admission: RE | Admit: 2018-08-28 | Payer: Medicaid Other | Source: Ambulatory Visit

## 2018-08-28 DIAGNOSIS — Z3A26 26 weeks gestation of pregnancy: Secondary | ICD-10-CM | POA: Diagnosis not present

## 2018-08-28 DIAGNOSIS — Z3480 Encounter for supervision of other normal pregnancy, unspecified trimester: Secondary | ICD-10-CM

## 2018-08-28 DIAGNOSIS — E669 Obesity, unspecified: Secondary | ICD-10-CM | POA: Insufficient documentation

## 2018-08-28 DIAGNOSIS — O99212 Obesity complicating pregnancy, second trimester: Secondary | ICD-10-CM | POA: Insufficient documentation

## 2018-08-28 DIAGNOSIS — Z3689 Encounter for other specified antenatal screening: Secondary | ICD-10-CM | POA: Diagnosis not present

## 2018-08-28 DIAGNOSIS — Z0489 Encounter for examination and observation for other specified reasons: Secondary | ICD-10-CM

## 2018-08-28 DIAGNOSIS — IMO0002 Reserved for concepts with insufficient information to code with codable children: Secondary | ICD-10-CM

## 2018-09-09 ENCOUNTER — Other Ambulatory Visit: Payer: 59

## 2018-09-09 ENCOUNTER — Ambulatory Visit (INDEPENDENT_AMBULATORY_CARE_PROVIDER_SITE_OTHER): Payer: 59 | Admitting: Advanced Practice Midwife

## 2018-09-09 ENCOUNTER — Encounter: Payer: Self-pay | Admitting: Advanced Practice Midwife

## 2018-09-09 VITALS — BP 128/60 | Wt 219.0 lb

## 2018-09-09 DIAGNOSIS — Z3480 Encounter for supervision of other normal pregnancy, unspecified trimester: Secondary | ICD-10-CM

## 2018-09-09 DIAGNOSIS — Z131 Encounter for screening for diabetes mellitus: Secondary | ICD-10-CM

## 2018-09-09 DIAGNOSIS — Z3A27 27 weeks gestation of pregnancy: Secondary | ICD-10-CM

## 2018-09-09 DIAGNOSIS — Z3482 Encounter for supervision of other normal pregnancy, second trimester: Secondary | ICD-10-CM

## 2018-09-09 LAB — POCT URINALYSIS DIPSTICK OB: Glucose, UA: NEGATIVE

## 2018-09-09 NOTE — Progress Notes (Signed)
ROB 28 week labs 

## 2018-09-09 NOTE — Progress Notes (Signed)
  Routine Prenatal Care Visit  Subjective  Michelle Bates is a 27 y.o. G3P1011 at [redacted]w[redacted]d being seen today for ongoing prenatal care.  She is currently monitored for the following issues for this low-risk pregnancy and has Supervision of other normal pregnancy, antepartum on their problem list.  ----------------------------------------------------------------------------------- Patient reports no complaints.  Discussion of use of placenta postpartum. She would like to take the placenta home and is considering consumption. All questions answered.  Contractions: Not present. Vag. Bleeding: None.  Movement: Present. Denies leaking of fluid.  ----------------------------------------------------------------------------------- The following portions of the patient's history were reviewed and updated as appropriate: allergies, current medications, past family history, past medical history, past social history, past surgical history and problem list. Problem list updated.   Objective  Blood pressure 128/60, weight 219 lb (99.3 kg), last menstrual period 02/26/2018 Pregravid weight 201 lb (91.2 kg) Total Weight Gain 18 lb (8.165 kg) Urinalysis: Urine Protein Trace  Urine Glucose Negative  Fetal Status: Fetal Heart Rate (bpm): 139 Fundal Height: 28 cm Movement: Present     General:  Alert, oriented and cooperative. Patient is in no acute distress.  Skin: Skin is warm and dry. No rash noted.   Cardiovascular: Normal heart rate noted  Respiratory: Normal respiratory effort, no problems with respiration noted  Abdomen: Soft, gravid, appropriate for gestational age. Pain/Pressure: Absent     Pelvic:  Cervical exam deferred        Extremities: Normal range of motion.  Edema: None  Mental Status: Normal mood and affect. Normal behavior. Normal judgment and thought content.   Assessment   27 y.o. G3P1011 at [redacted]w[redacted]d by  12/03/2018, by Ultrasound presenting for routine prenatal visit  Plan   pregnancy #3  Problems (from 05/12/18 to present)    Problem Noted Resolved   Supervision of other normal pregnancy, antepartum 08/12/2018 by Vena Austria, MD No   Overview Addendum 08/28/2018  9:02 PM by Vena Austria, MD    [facility]inic [facility] Prenatal Labs  Dating 10 week [place] Blood type: O/Positive/-- (06/10 1121)   Genetic Screen NIPS: Normal XX Antibody:Negative (06/10 1121)  Anatomic [place] Incomplete cardiac views DP normal  Rubella: 3.14 (06/10 1121) Varicella: Immune  GTT Early:               Third trimester:  RPR: Non Reactive (06/10 1121)   Rhogam N/A HBsAg: Negative (06/10 1121)   TDaP vaccine                       Flu Shot: HIV: Non Reactive (06/10 1121)   Baby Food                                GBS:   Contraception  Pap: 12/13/2017 NIL HPV negative  CBB   Pelvis tested to 6lbs 4oz  CS/VBAC    Support Person                Preterm labor symptoms and general obstetric precautions including but not limited to vaginal bleeding, contractions, leaking of fluid and fetal movement were reviewed in detail with the patient. Please refer to After Visit Summary for other counseling recommendations.   Return in about 2 weeks (around 09/23/2018) for rob.  Tresea Mall, CNM 09/09/2018 4:10 PM

## 2018-09-09 NOTE — Patient Instructions (Signed)
Third Trimester of Pregnancy The third trimester is from week 28 through week 40 (months 7 through 9). The third trimester is a time when the unborn baby (fetus) is growing rapidly. At the end of the ninth month, the fetus is about 20 inches in length and weighs 6-10 pounds. Body changes during your third trimester Your body will continue to go through many changes during pregnancy. The changes vary from woman to woman. During the third trimester:  Your weight will continue to increase. You can expect to gain 25-35 pounds (11-16 kg) by the end of the pregnancy.  You may begin to get stretch marks on your hips, abdomen, and breasts.  You may urinate more often because the fetus is moving lower into your pelvis and pressing on your bladder.  You may develop or continue to have heartburn. This is caused by increased hormones that slow down muscles in the digestive tract.  You may develop or continue to have constipation because increased hormones slow digestion and cause the muscles that push waste through your intestines to relax.  You may develop hemorrhoids. These are swollen veins (varicose veins) in the rectum that can itch or be painful.  You may develop swollen, bulging veins (varicose veins) in your legs.  You may have increased body aches in the pelvis, back, or thighs. This is due to weight gain and increased hormones that are relaxing your joints.  You may have changes in your hair. These can include thickening of your hair, rapid growth, and changes in texture. Some women also have hair loss during or after pregnancy, or hair that feels dry or thin. Your hair will most likely return to normal after your baby is born.  Your breasts will continue to grow and they will continue to become tender. A yellow fluid (colostrum) may leak from your breasts. This is the first milk you are producing for your baby.  Your belly button may stick out.  You may notice more swelling in your hands,  face, or ankles.  You may have increased tingling or numbness in your hands, arms, and legs. The skin on your belly may also feel numb.  You may feel short of breath because of your expanding uterus.  You may have more problems sleeping. This can be caused by the size of your belly, increased need to urinate, and an increase in your body's metabolism.  You may notice the fetus "dropping," or moving lower in your abdomen (lightening).  You may have increased vaginal discharge.  You may notice your joints feel loose and you may have pain around your pelvic bone.  What to expect at prenatal visits You will have prenatal exams every 2 weeks until week 36. Then you will have weekly prenatal exams. During a routine prenatal visit:  You will be weighed to make sure you and the baby are growing normally.  Your blood pressure will be taken.  Your abdomen will be measured to track your baby's growth.  The fetal heartbeat will be listened to.  Any test results from the previous visit will be discussed.  You may have a cervical check near your due date to see if your cervix has softened or thinned (effaced).  You will be tested for Group B streptococcus. This happens between 35 and 37 weeks.  Your health care provider may ask you:  What your birth plan is.  How you are feeling.  If you are feeling the baby move.  If you have had   any abnormal symptoms, such as leaking fluid, bleeding, severe headaches, or abdominal cramping.  If you are using any tobacco products, including cigarettes, chewing tobacco, and electronic cigarettes.  If you have any questions.  Other tests or screenings that may be performed during your third trimester include:  Blood tests that check for low iron levels (anemia).  Fetal testing to check the health, activity level, and growth of the fetus. Testing is done if you have certain medical conditions or if there are problems during the  pregnancy.  Nonstress test (NST). This test checks the health of your baby to make sure there are no signs of problems, such as the baby not getting enough oxygen. During this test, a belt is placed around your belly. The baby is made to move, and its heart rate is monitored during movement.  What is false labor? False labor is a condition in which you feel small, irregular tightenings of the muscles in the womb (contractions) that usually go away with rest, changing position, or drinking water. These are called Braxton Hicks contractions. Contractions may last for hours, days, or even weeks before true labor sets in. If contractions come at regular intervals, become more frequent, increase in intensity, or become painful, you should see your health care provider. What are the signs of labor?  Abdominal cramps.  Regular contractions that start at 10 minutes apart and become stronger and more frequent with time.  Contractions that start on the top of the uterus and spread down to the lower abdomen and back.  Increased pelvic pressure and dull back pain.  A watery or bloody mucus discharge that comes from the vagina.  Leaking of amniotic fluid. This is also known as your "water breaking." It could be a slow trickle or a gush. Let your health care provider know if it has a color or strange odor. If you have any of these signs, call your health care provider right away, even if it is before your due date. Follow these instructions at home: Medicines  Follow your health care provider's instructions regarding medicine use. Specific medicines may be either safe or unsafe to take during pregnancy.  Take a prenatal vitamin that contains at least 600 micrograms (mcg) of folic acid.  If you develop constipation, try taking a stool softener if your health care provider approves. Eating and drinking  Eat a balanced diet that includes fresh fruits and vegetables, whole grains, good sources of protein  such as meat, eggs, or tofu, and low-fat dairy. Your health care provider will help you determine the amount of weight gain that is right for you.  Avoid raw meat and uncooked cheese. These carry germs that can cause birth defects in the baby.  If you have low calcium intake from food, talk to your health care provider about whether you should take a daily calcium supplement.  Eat four or five small meals rather than three large meals a day.  Limit foods that are high in fat and processed sugars, such as fried and sweet foods.  To prevent constipation: ? Drink enough fluid to keep your urine clear or pale yellow. ? Eat foods that are high in fiber, such as fresh fruits and vegetables, whole grains, and beans. Activity  Exercise only as directed by your health care provider. Most women can continue their usual exercise routine during pregnancy. Try to exercise for 30 minutes at least 5 days a week. Stop exercising if you experience uterine contractions.  Avoid heavy   lifting.  Do not exercise in extreme heat or humidity, or at high altitudes.  Wear low-heel, comfortable shoes.  Practice good posture.  You may continue to have sex unless your health care provider tells you otherwise. Relieving pain and discomfort  Take frequent breaks and rest with your legs elevated if you have leg cramps or low back pain.  Take warm sitz baths to soothe any pain or discomfort caused by hemorrhoids. Use hemorrhoid cream if your health care provider approves.  Wear a good support bra to prevent discomfort from breast tenderness.  If you develop varicose veins: ? Wear support pantyhose or compression stockings as told by your healthcare provider. ? Elevate your feet for 15 minutes, 3-4 times a day. Prenatal care  Write down your questions. Take them to your prenatal visits.  Keep all your prenatal visits as told by your health care provider. This is important. Safety  Wear your seat belt at  all times when driving.  Make a list of emergency phone numbers, including numbers for family, friends, the hospital, and police and fire departments. General instructions  Avoid cat litter boxes and soil used by cats. These carry germs that can cause birth defects in the baby. If you have a cat, ask someone to clean the litter box for you.  Do not travel far distances unless it is absolutely necessary and only with the approval of your health care provider.  Do not use hot tubs, steam rooms, or saunas.  Do not drink alcohol.  Do not use any products that contain nicotine or tobacco, such as cigarettes and e-cigarettes. If you need help quitting, ask your health care provider.  Do not use any medicinal herbs or unprescribed drugs. These chemicals affect the formation and growth of the baby.  Do not douche or use tampons or scented sanitary pads.  Do not cross your legs for long periods of time.  To prepare for the arrival of your baby: ? Take prenatal classes to understand, practice, and ask questions about labor and delivery. ? Make a trial run to the hospital. ? Visit the hospital and tour the maternity area. ? Arrange for maternity or paternity leave through employers. ? Arrange for family and friends to take care of pets while you are in the hospital. ? Purchase a rear-facing car seat and make sure you know how to install it in your car. ? Pack your hospital bag. ? Prepare the baby's nursery. Make sure to remove all pillows and stuffed animals from the baby's crib to prevent suffocation.  Visit your dentist if you have not gone during your pregnancy. Use a soft toothbrush to brush your teeth and be gentle when you floss. Contact a health care provider if:  You are unsure if you are in labor or if your water has broken.  You become dizzy.  You have mild pelvic cramps, pelvic pressure, or nagging pain in your abdominal area.  You have lower back pain.  You have persistent  nausea, vomiting, or diarrhea.  You have an unusual or bad smelling vaginal discharge.  You have pain when you urinate. Get help right away if:  Your water breaks before 37 weeks.  You have regular contractions less than 5 minutes apart before 37 weeks.  You have a fever.  You are leaking fluid from your vagina.  You have spotting or bleeding from your vagina.  You have severe abdominal pain or cramping.  You have rapid weight loss or weight gain.    You have shortness of breath with chest pain.  You notice sudden or extreme swelling of your face, hands, ankles, feet, or legs.  Your baby makes fewer than 10 movements in 2 hours.  You have severe headaches that do not go away when you take medicine.  You have vision changes. Summary  The third trimester is from week 28 through week 40, months 7 through 9. The third trimester is a time when the unborn baby (fetus) is growing rapidly.  During the third trimester, your discomfort may increase as you and your baby continue to gain weight. You may have abdominal, leg, and back pain, sleeping problems, and an increased need to urinate.  During the third trimester your breasts will keep growing and they will continue to become tender. A yellow fluid (colostrum) may leak from your breasts. This is the first milk you are producing for your baby.  False labor is a condition in which you feel small, irregular tightenings of the muscles in the womb (contractions) that eventually go away. These are called Braxton Hicks contractions. Contractions may last for hours, days, or even weeks before true labor sets in.  Signs of labor can include: abdominal cramps; regular contractions that start at 10 minutes apart and become stronger and more frequent with time; watery or bloody mucus discharge that comes from the vagina; increased pelvic pressure and dull back pain; and leaking of amniotic fluid. This information is not intended to replace advice  given to you by your health care provider. Make sure you discuss any questions you have with your health care provider. Document Released: 11/20/2001 Document Revised: 05/03/2016 Document Reviewed: 01/27/2013 Elsevier Interactive Patient Education  2017 [employer]  

## 2018-09-10 LAB — 28 WEEK RH+PANEL
BASOS: 0 %
Basophils Absolute: 0 10*3/uL (ref 0.0–0.2)
EOS (ABSOLUTE): 0.2 10*3/uL (ref 0.0–0.4)
Eos: 2 %
GESTATIONAL DIABETES SCREEN: 97 mg/dL (ref 65–139)
HIV Screen 4th Generation wRfx: NONREACTIVE
Hematocrit: 34.7 % (ref 34.0–46.6)
Hemoglobin: 11.6 g/dL (ref 11.1–15.9)
IMMATURE GRANULOCYTES: 1 %
Immature Grans (Abs): 0.1 10*3/uL (ref 0.0–0.1)
LYMPHS ABS: 2.2 10*3/uL (ref 0.7–3.1)
LYMPHS: 26 %
MCH: 26.4 pg — ABNORMAL LOW (ref 26.6–33.0)
MCHC: 33.4 g/dL (ref 31.5–35.7)
MCV: 79 fL (ref 79–97)
MONOS ABS: 0.5 10*3/uL (ref 0.1–0.9)
Monocytes: 6 %
NEUTROS ABS: 5.4 10*3/uL (ref 1.4–7.0)
Neutrophils: 65 %
Platelets: 255 10*3/uL (ref 150–450)
RBC: 4.4 x10E6/uL (ref 3.77–5.28)
RDW: 13.2 % (ref 12.3–15.4)
RPR: NONREACTIVE
WBC: 8.4 10*3/uL (ref 3.4–10.8)

## 2018-09-23 ENCOUNTER — Encounter: Payer: Self-pay | Admitting: Maternal Newborn

## 2018-09-23 ENCOUNTER — Ambulatory Visit (INDEPENDENT_AMBULATORY_CARE_PROVIDER_SITE_OTHER): Payer: 59 | Admitting: Maternal Newborn

## 2018-09-23 VITALS — BP 116/70 | Wt 217.0 lb

## 2018-09-23 DIAGNOSIS — Z3480 Encounter for supervision of other normal pregnancy, unspecified trimester: Secondary | ICD-10-CM

## 2018-09-23 DIAGNOSIS — Z3A29 29 weeks gestation of pregnancy: Secondary | ICD-10-CM

## 2018-09-23 DIAGNOSIS — Z3483 Encounter for supervision of other normal pregnancy, third trimester: Secondary | ICD-10-CM

## 2018-09-23 LAB — POCT URINALYSIS DIPSTICK OB: Glucose, UA: NEGATIVE

## 2018-09-23 NOTE — Patient Instructions (Signed)
Third Trimester of Pregnancy The third trimester is from week 28 through week 40 (months 7 through 9). The third trimester is a time when the unborn baby (fetus) is growing rapidly. At the end of the ninth month, the fetus is about 20 inches in length and weighs 6-10 pounds. Body changes during your third trimester Your body will continue to go through many changes during pregnancy. The changes vary from woman to woman. During the third trimester:  Your weight will continue to increase. You can expect to gain 25-35 pounds (11-16 kg) by the end of the pregnancy.  You may begin to get stretch marks on your hips, abdomen, and breasts.  You may urinate more often because the fetus is moving lower into your pelvis and pressing on your bladder.  You may develop or continue to have heartburn. This is caused by increased hormones that slow down muscles in the digestive tract.  You may develop or continue to have constipation because increased hormones slow digestion and cause the muscles that push waste through your intestines to relax.  You may develop hemorrhoids. These are swollen veins (varicose veins) in the rectum that can itch or be painful.  You may develop swollen, bulging veins (varicose veins) in your legs.  You may have increased body aches in the pelvis, back, or thighs. This is due to weight gain and increased hormones that are relaxing your joints.  You may have changes in your hair. These can include thickening of your hair, rapid growth, and changes in texture. Some women also have hair loss during or after pregnancy, or hair that feels dry or thin. Your hair will most likely return to normal after your baby is born.  Your breasts will continue to grow and they will continue to become tender. A yellow fluid (colostrum) may leak from your breasts. This is the first milk you are producing for your baby.  Your belly button may stick out.  You may notice more swelling in your hands,  face, or ankles.  You may have increased tingling or numbness in your hands, arms, and legs. The skin on your belly may also feel numb.  You may feel short of breath because of your expanding uterus.  You may have more problems sleeping. This can be caused by the size of your belly, increased need to urinate, and an increase in your body's metabolism.  You may notice the fetus "dropping," or moving lower in your abdomen (lightening).  You may have increased vaginal discharge.  You may notice your joints feel loose and you may have pain around your pelvic bone.  What to expect at prenatal visits You will have prenatal exams every 2 weeks until week 36. Then you will have weekly prenatal exams. During a routine prenatal visit:  You will be weighed to make sure you and the baby are growing normally.  Your blood pressure will be taken.  Your abdomen will be measured to track your baby's growth.  The fetal heartbeat will be listened to.  Any test results from the previous visit will be discussed.  You may have a cervical check near your due date to see if your cervix has softened or thinned (effaced).  You will be tested for Group B streptococcus. This happens between 35 and 37 weeks.  Your health care provider may ask you:  What your birth plan is.  How you are feeling.  If you are feeling the baby move.  If you have had   any abnormal symptoms, such as leaking fluid, bleeding, severe headaches, or abdominal cramping.  If you are using any tobacco products, including cigarettes, chewing tobacco, and electronic cigarettes.  If you have any questions.  Other tests or screenings that may be performed during your third trimester include:  Blood tests that check for low iron levels (anemia).  Fetal testing to check the health, activity level, and growth of the fetus. Testing is done if you have certain medical conditions or if there are problems during the  pregnancy.  Nonstress test (NST). This test checks the health of your baby to make sure there are no signs of problems, such as the baby not getting enough oxygen. During this test, a belt is placed around your belly. The baby is made to move, and its heart rate is monitored during movement.  What is false labor? False labor is a condition in which you feel small, irregular tightenings of the muscles in the womb (contractions) that usually go away with rest, changing position, or drinking water. These are called Braxton Hicks contractions. Contractions may last for hours, days, or even weeks before true labor sets in. If contractions come at regular intervals, become more frequent, increase in intensity, or become painful, you should see your health care provider. What are the signs of labor?  Abdominal cramps.  Regular contractions that start at 10 minutes apart and become stronger and more frequent with time.  Contractions that start on the top of the uterus and spread down to the lower abdomen and back.  Increased pelvic pressure and dull back pain.  A watery or bloody mucus discharge that comes from the vagina.  Leaking of amniotic fluid. This is also known as your "water breaking." It could be a slow trickle or a gush. Let your health care provider know if it has a color or strange odor. If you have any of these signs, call your health care provider right away, even if it is before your due date. Follow these instructions at home: Medicines  Follow your health care provider's instructions regarding medicine use. Specific medicines may be either safe or unsafe to take during pregnancy.  Take a prenatal vitamin that contains at least 600 micrograms (mcg) of folic acid.  If you develop constipation, try taking a stool softener if your health care provider approves. Eating and drinking  Eat a balanced diet that includes fresh fruits and vegetables, whole grains, good sources of protein  such as meat, eggs, or tofu, and low-fat dairy. Your health care provider will help you determine the amount of weight gain that is right for you.  Avoid raw meat and uncooked cheese. These carry germs that can cause birth defects in the baby.  If you have low calcium intake from food, talk to your health care provider about whether you should take a daily calcium supplement.  Eat four or five small meals rather than three large meals a day.  Limit foods that are high in fat and processed sugars, such as fried and sweet foods.  To prevent constipation: ? Drink enough fluid to keep your urine clear or pale yellow. ? Eat foods that are high in fiber, such as fresh fruits and vegetables, whole grains, and beans. Activity  Exercise only as directed by your health care provider. Most women can continue their usual exercise routine during pregnancy. Try to exercise for 30 minutes at least 5 days a week. Stop exercising if you experience uterine contractions.  Avoid heavy   lifting.  Do not exercise in extreme heat or humidity, or at high altitudes.  Wear low-heel, comfortable shoes.  Practice good posture.  You may continue to have sex unless your health care provider tells you otherwise. Relieving pain and discomfort  Take frequent breaks and rest with your legs elevated if you have leg cramps or low back pain.  Take warm sitz baths to soothe any pain or discomfort caused by hemorrhoids. Use hemorrhoid cream if your health care provider approves.  Wear a good support bra to prevent discomfort from breast tenderness.  If you develop varicose veins: ? Wear support pantyhose or compression stockings as told by your healthcare provider. ? Elevate your feet for 15 minutes, 3-4 times a day. Prenatal care  Write down your questions. Take them to your prenatal visits.  Keep all your prenatal visits as told by your health care provider. This is important. Safety  Wear your seat belt at  all times when driving.  Make a list of emergency phone numbers, including numbers for family, friends, the hospital, and police and fire departments. General instructions  Avoid cat litter boxes and soil used by cats. These carry germs that can cause birth defects in the baby. If you have a cat, ask someone to clean the litter box for you.  Do not travel far distances unless it is absolutely necessary and only with the approval of your health care provider.  Do not use hot tubs, steam rooms, or saunas.  Do not drink alcohol.  Do not use any products that contain nicotine or tobacco, such as cigarettes and e-cigarettes. If you need help quitting, ask your health care provider.  Do not use any medicinal herbs or unprescribed drugs. These chemicals affect the formation and growth of the baby.  Do not douche or use tampons or scented sanitary pads.  Do not cross your legs for long periods of time.  To prepare for the arrival of your baby: ? Take prenatal classes to understand, practice, and ask questions about labor and delivery. ? Make a trial run to the hospital. ? Visit the hospital and tour the maternity area. ? Arrange for maternity or paternity leave through employers. ? Arrange for family and friends to take care of pets while you are in the hospital. ? Purchase a rear-facing car seat and make sure you know how to install it in your car. ? Pack your hospital bag. ? Prepare the baby's nursery. Make sure to remove all pillows and stuffed animals from the baby's crib to prevent suffocation.  Visit your dentist if you have not gone during your pregnancy. Use a soft toothbrush to brush your teeth and be gentle when you floss. Contact a health care provider if:  You are unsure if you are in labor or if your water has broken.  You become dizzy.  You have mild pelvic cramps, pelvic pressure, or nagging pain in your abdominal area.  You have lower back pain.  You have persistent  nausea, vomiting, or diarrhea.  You have an unusual or bad smelling vaginal discharge.  You have pain when you urinate. Get help right away if:  Your water breaks before 37 weeks.  You have regular contractions less than 5 minutes apart before 37 weeks.  You have a fever.  You are leaking fluid from your vagina.  You have spotting or bleeding from your vagina.  You have severe abdominal pain or cramping.  You have rapid weight loss or weight gain.    You have shortness of breath with chest pain.  You notice sudden or extreme swelling of your face, hands, ankles, feet, or legs.  Your baby makes fewer than 10 movements in 2 hours.  You have severe headaches that do not go away when you take medicine.  You have vision changes. Summary  The third trimester is from week 28 through week 40, months 7 through 9. The third trimester is a time when the unborn baby (fetus) is growing rapidly.  During the third trimester, your discomfort may increase as you and your baby continue to gain weight. You may have abdominal, leg, and back pain, sleeping problems, and an increased need to urinate.  During the third trimester your breasts will keep growing and they will continue to become tender. A yellow fluid (colostrum) may leak from your breasts. This is the first milk you are producing for your baby.  False labor is a condition in which you feel small, irregular tightenings of the muscles in the womb (contractions) that eventually go away. These are called Braxton Hicks contractions. Contractions may last for hours, days, or even weeks before true labor sets in.  Signs of labor can include: abdominal cramps; regular contractions that start at 10 minutes apart and become stronger and more frequent with time; watery or bloody mucus discharge that comes from the vagina; increased pelvic pressure and dull back pain; and leaking of amniotic fluid. This information is not intended to replace advice  given to you by your health care provider. Make sure you discuss any questions you have with your health care provider. Document Released: 11/20/2001 Document Revised: 05/03/2016 Document Reviewed: 01/27/2013 Elsevier Interactive Patient Education  2017 [employer]  

## 2018-09-23 NOTE — Progress Notes (Signed)
    Routine Prenatal Care Visit  Subjective  GENEVEIVE FURNESS is a 27 y.o. G3P1011 at [redacted]w[redacted]d being seen today for ongoing prenatal care.  She is currently monitored for the following issues for this low-risk pregnancy and has Supervision of other normal pregnancy, antepartum on their problem list.  ----------------------------------------------------------------------------------- Patient reports nasal congestion    Contractions: Not present. Vag. Bleeding: None.  Movement: Present. No leaking of fluid.  ----------------------------------------------------------------------------------- The following portions of the patient's history were reviewed and updated as appropriate: allergies, current medications, past family history, past medical history, past social history, past surgical history and problem list. Problem list updated.  Objective   Vitals:   09/23/18 0835  BP: 116/70   Pregravid weight 201 lb (91.2 kg) Total Weight Gain 16 lb (7.258 kg) Body mass index is 37.25 kg/m.   Urinalysis: Protein Trace, Glucose Negative Fetal Status: Fetal Heart Rate (bpm): 138 Fundal Height: 29 cm Movement: Present     General:  Alert, oriented and cooperative. Patient is in no acute distress.  Skin: Skin is warm and dry. No rash noted.   Cardiovascular: Normal heart rate noted  Respiratory: Normal respiratory effort, no problems with respiration noted  Abdomen: Soft, gravid, appropriate for gestational age. Pain/Pressure: Absent     Pelvic:  Cervical exam deferred        Extremities: Normal range of motion.  Edema: None  Mental Status: Normal mood and affect. Normal behavior. Normal judgment and thought content.     Assessment   27 y.o. G3P1011 at [redacted]w[redacted]d, EDD 12/03/2018 by Ultrasound presenting for a routine prenatal visit.  Plan   pregnancy #3 Problems (from 05/12/18 to present)    Problem Noted Resolved   Supervision of other normal pregnancy, antepartum 08/12/2018 by Vena Austria,  MD No   Overview Addendum 09/10/2018  1:01 PM by Vena Austria, MD    [facility] Prenatal Labs  Dating 10 week [place] Blood type: O/Positive/-- (06/10 1121)   Genetic Screen NIPS: Normal XX Antibody:Negative (06/10 1121)  Anatomic [place] Incomplete cardiac views DP normal  Rubella: 3.14 (06/10 1121) Varicella: Immune  GTT 97 RPR: Non Reactive (06/10 1121)   Rhogam N/A HBsAg: Negative (06/10 1121)   TDaP vaccine                       Flu Shot: HIV: Non Reactive (06/10 1121)   Baby Food                                GBS:   Contraception  Pap: 12/13/2017 NIL HPV negative  CBB   Pelvis tested to 6lbs 4oz  CS/VBAC    Support Person               Discussed OTC antihistamines such as Claritin or Benadryl and saline nasal spray to help with congestion.  Please refer to After Visit Summary for other counseling recommendations.   Return in about 2 weeks (around 10/07/2018) for ROB.  Marcelyn Bruins, CNM 09/23/2018  8:51 AM

## 2018-10-07 ENCOUNTER — Ambulatory Visit (INDEPENDENT_AMBULATORY_CARE_PROVIDER_SITE_OTHER): Payer: Managed Care, Other (non HMO) | Admitting: Advanced Practice Midwife

## 2018-10-07 ENCOUNTER — Encounter: Payer: Self-pay | Admitting: Advanced Practice Midwife

## 2018-10-07 VITALS — BP 122/74 | Wt 222.0 lb

## 2018-10-07 DIAGNOSIS — Z23 Encounter for immunization: Secondary | ICD-10-CM | POA: Diagnosis not present

## 2018-10-07 DIAGNOSIS — Z3A31 31 weeks gestation of pregnancy: Secondary | ICD-10-CM

## 2018-10-07 DIAGNOSIS — Z3483 Encounter for supervision of other normal pregnancy, third trimester: Secondary | ICD-10-CM

## 2018-10-07 NOTE — Addendum Note (Signed)
Addended by: Liliane Shi on: 10/07/2018 09:23 AM   Modules accepted: Orders

## 2018-10-07 NOTE — Progress Notes (Signed)
No vb. No lof.  

## 2018-10-07 NOTE — Progress Notes (Signed)
  Routine Prenatal Care Visit  Subjective  Michelle Bates is a 27 y.o. G3P1011 at [redacted]w[redacted]d being seen today for ongoing prenatal care.  She is currently monitored for the following issues for this low-risk pregnancy and has Supervision of other normal pregnancy, antepartum on their problem list.  ----------------------------------------------------------------------------------- Patient reports she is doing well. She has some heartburn and is taking TUMS as needed. Discussion of breastfeeding- techniques, comfort measures, pumping, etc.   Contractions: Not present. Vag. Bleeding: None.  Movement: Present. Denies leaking of fluid.  ----------------------------------------------------------------------------------- The following portions of the patient's history were reviewed and updated as appropriate: allergies, current medications, past family history, past medical history, past social history, past surgical history and problem list. Problem list updated.   Objective  Blood pressure 122/74, weight 222 lb (100.7 kg), last menstrual period 02/26/2018, unknown if currently breastfeeding. Pregravid weight 201 lb (91.2 kg) Total Weight Gain 21 lb (9.526 kg) Urinalysis: Urine Protein    Urine Glucose    Fetal Status: Fetal Heart Rate (bpm): 135 Fundal Height: 32 cm Movement: Present     General:  Alert, oriented and cooperative. Patient is in no acute distress.  Skin: Skin is warm and dry. No rash noted.   Cardiovascular: Normal heart rate noted  Respiratory: Normal respiratory effort, no problems with respiration noted  Abdomen: Soft, gravid, appropriate for gestational age. Pain/Pressure: Absent     Pelvic:  Cervical exam deferred        Extremities: Normal range of motion.     Mental Status: Normal mood and affect. Normal behavior. Normal judgment and thought content.   Assessment   27 y.o. G3P1011 at [redacted]w[redacted]d by  12/03/2018, by Ultrasound presenting for routine prenatal visit  Plan    pregnancy #3 Problems (from 05/12/18 to present)    Problem Noted Resolved   Supervision of other normal pregnancy, antepartum 08/12/2018 by Vena Austria, MD No   Overview Addendum 09/10/2018  1:01 PM by Vena Austria, MD    [facility] Prenatal Labs  Dating 10 week [place] Blood type: O/Positive/-- (06/10 1121)   Genetic Screen NIPS: Normal XX Antibody:Negative (06/10 1121)  Anatomic [place] Incomplete cardiac views DP normal  Rubella: 3.14 (06/10 1121) Varicella: Immune  GTT 97 RPR: Non Reactive (06/10 1121)   Rhogam N/A HBsAg: Negative (06/10 1121)   TDaP vaccine  10/07/18 Flu Shot: HIV: Non Reactive (06/10 1121)   Baby Food  Breast                              GBS:   Contraception  BTL Pap: 12/13/2017 NIL HPV negative  CBB   Pelvis tested to 6lbs 4oz  CS/VBAC  NA   Support Person                Preterm labor symptoms and general obstetric precautions including but not limited to vaginal bleeding, contractions, leaking of fluid and fetal movement were reviewed in detail with the patient. TDAP today   Return in about 2 weeks (around 10/21/2018) for rob.  Tresea Mall, CNM 10/07/2018 9:13 AM

## 2018-10-21 ENCOUNTER — Ambulatory Visit (INDEPENDENT_AMBULATORY_CARE_PROVIDER_SITE_OTHER): Payer: Managed Care, Other (non HMO) | Admitting: Obstetrics and Gynecology

## 2018-10-21 ENCOUNTER — Encounter: Payer: Self-pay | Admitting: Obstetrics and Gynecology

## 2018-10-21 VITALS — BP 110/60 | Wt 220.5 lb

## 2018-10-21 DIAGNOSIS — Z3480 Encounter for supervision of other normal pregnancy, unspecified trimester: Secondary | ICD-10-CM

## 2018-10-21 DIAGNOSIS — Z3483 Encounter for supervision of other normal pregnancy, third trimester: Secondary | ICD-10-CM

## 2018-10-21 DIAGNOSIS — Z3A33 33 weeks gestation of pregnancy: Secondary | ICD-10-CM

## 2018-10-21 LAB — POCT URINALYSIS DIPSTICK OB
Glucose, UA: NEGATIVE
PROTEIN: NEGATIVE

## 2018-10-21 NOTE — Progress Notes (Signed)
ROB No concerns  Declines flu shot  

## 2018-10-21 NOTE — Progress Notes (Signed)
    Routine Prenatal Care Visit  Subjective  Michelle Bates is a 27 y.o. G3P1011 at [redacted]w[redacted]d being seen today for ongoing prenatal care.  She is currently monitored for the following issues for this low-risk pregnancy and has Supervision of other normal pregnancy, antepartum on their problem list.  ----------------------------------------------------------------------------------- Patient reports no complaints.   Contractions: Not present. Vag. Bleeding: None.  Movement: Present. Denies leaking of fluid.  ----------------------------------------------------------------------------------- The following portions of the patient's history were reviewed and updated as appropriate: allergies, current medications, past family history, past medical history, past social history, past surgical history and problem list. Problem list updated.   Objective  Blood pressure 110/60, weight 220 lb 8 oz (100 kg), last menstrual period 02/26/2018, unknown if currently breastfeeding. Pregravid weight 201 lb (91.2 kg) Total Weight Gain 19 lb 8 oz (8.845 kg) Urinalysis:      Fetal Status: Fetal Heart Rate (bpm): 141 Fundal Height: 33 cm Movement: Present     General:  Alert, oriented and cooperative. Patient is in no acute distress.  Skin: Skin is warm and dry. No rash noted.   Cardiovascular: Normal heart rate noted  Respiratory: Normal respiratory effort, no problems with respiration noted  Abdomen: Soft, gravid, appropriate for gestational age. Pain/Pressure: Absent     Pelvic:  Cervical exam deferred        Extremities: Normal range of motion.     Mental Status: Normal mood and affect. Normal behavior. Normal judgment and thought content.     Assessment   27 y.o. G3P1011 at [redacted]w[redacted]d by  12/03/2018, by Ultrasound presenting for routine prenatal visit  Plan   pregnancy #3 Problems (from 05/12/18 to present)    Problem Noted Resolved   Supervision of other normal pregnancy, antepartum 08/12/2018 by  Vena Austria, MD No   Overview Addendum 10/21/2018  8:48 AM by Natale Milch, MD    [facility] Prenatal Labs  Dating 10 week [place] Blood type: O/Positive/-- (06/10 1121)   Genetic Screen NIPS: Normal XX Antibody:Negative (06/10 1121)  Anatomic [place] Incomplete cardiac views DP normal  Rubella: 3.14 (06/10 1121) Varicella: Immune  GTT 97 RPR: Non Reactive (06/10 1121)   Rhogam N/A HBsAg: Negative (06/10 1121)   TDaP vaccine  10/07/18                      Flu Shot: Declines HIV: Non Reactive (06/10 1121)   Baby Food     Breast                           GBS:   Contraception  Tubal, consents signed 09/09/18 Pap: 12/13/2017 NIL HPV negative  CBB   Pelvis tested to 6lbs 4oz  CS/VBAC    Support Person                Gestational age appropriate obstetric precautions including but not limited to vaginal bleeding, contractions, leaking of fluid and fetal movement were reviewed in detail with the patient.    Return in about 2 weeks (around 11/04/2018) for ROB.  Natale Milch MD [facility], [facility] 10/21/2018, 9:16 AM

## 2018-11-04 ENCOUNTER — Encounter: Payer: Self-pay | Admitting: Maternal Newborn

## 2018-11-04 ENCOUNTER — Ambulatory Visit (INDEPENDENT_AMBULATORY_CARE_PROVIDER_SITE_OTHER): Payer: Managed Care, Other (non HMO) | Admitting: Maternal Newborn

## 2018-11-04 VITALS — BP 130/80 | Wt 220.0 lb

## 2018-11-04 DIAGNOSIS — Z3A35 35 weeks gestation of pregnancy: Secondary | ICD-10-CM

## 2018-11-04 DIAGNOSIS — Z3480 Encounter for supervision of other normal pregnancy, unspecified trimester: Secondary | ICD-10-CM

## 2018-11-04 DIAGNOSIS — Z3483 Encounter for supervision of other normal pregnancy, third trimester: Secondary | ICD-10-CM

## 2018-11-04 LAB — POCT URINALYSIS DIPSTICK OB
Glucose, UA: NEGATIVE
PROTEIN: NEGATIVE

## 2018-11-04 NOTE — Patient Instructions (Signed)
Third Trimester of Pregnancy The third trimester is from week 28 through week 40 (months 7 through 9). The third trimester is a time when the unborn baby (fetus) is growing rapidly. At the end of the ninth month, the fetus is about 20 inches in length and weighs 6-10 pounds. Body changes during your third trimester Your body will continue to go through many changes during pregnancy. The changes vary from woman to woman. During the third trimester:  Your weight will continue to increase. You can expect to gain 25-35 pounds (11-16 kg) by the end of the pregnancy.  You may begin to get stretch marks on your hips, abdomen, and breasts.  You may urinate more often because the fetus is moving lower into your pelvis and pressing on your bladder.  You may develop or continue to have heartburn. This is caused by increased hormones that slow down muscles in the digestive tract.  You may develop or continue to have constipation because increased hormones slow digestion and cause the muscles that push waste through your intestines to relax.  You may develop hemorrhoids. These are swollen veins (varicose veins) in the rectum that can itch or be painful.  You may develop swollen, bulging veins (varicose veins) in your legs.  You may have increased body aches in the pelvis, back, or thighs. This is due to weight gain and increased hormones that are relaxing your joints.  You may have changes in your hair. These can include thickening of your hair, rapid growth, and changes in texture. Some women also have hair loss during or after pregnancy, or hair that feels dry or thin. Your hair will most likely return to normal after your baby is born.  Your breasts will continue to grow and they will continue to become tender. A yellow fluid (colostrum) may leak from your breasts. This is the first milk you are producing for your baby.  Your belly button may stick out.  You may notice more swelling in your hands,  face, or ankles.  You may have increased tingling or numbness in your hands, arms, and legs. The skin on your belly may also feel numb.  You may feel short of breath because of your expanding uterus.  You may have more problems sleeping. This can be caused by the size of your belly, increased need to urinate, and an increase in your body's metabolism.  You may notice the fetus "dropping," or moving lower in your abdomen (lightening).  You may have increased vaginal discharge.  You may notice your joints feel loose and you may have pain around your pelvic bone.  What to expect at prenatal visits You will have prenatal exams every 2 weeks until week 36. Then you will have weekly prenatal exams. During a routine prenatal visit:  You will be weighed to make sure you and the baby are growing normally.  Your blood pressure will be taken.  Your abdomen will be measured to track your baby's growth.  The fetal heartbeat will be listened to.  Any test results from the previous visit will be discussed.  You may have a cervical check near your due date to see if your cervix has softened or thinned (effaced).  You will be tested for Group B streptococcus. This happens between 35 and 37 weeks.  Your health care provider may ask you:  What your birth plan is.  How you are feeling.  If you are feeling the baby move.  If you have had   any abnormal symptoms, such as leaking fluid, bleeding, severe headaches, or abdominal cramping.  If you are using any tobacco products, including cigarettes, chewing tobacco, and electronic cigarettes.  If you have any questions.  Other tests or screenings that may be performed during your third trimester include:  Blood tests that check for low iron levels (anemia).  Fetal testing to check the health, activity level, and growth of the fetus. Testing is done if you have certain medical conditions or if there are problems during the  pregnancy.  Nonstress test (NST). This test checks the health of your baby to make sure there are no signs of problems, such as the baby not getting enough oxygen. During this test, a belt is placed around your belly. The baby is made to move, and its heart rate is monitored during movement.  What is false labor? False labor is a condition in which you feel small, irregular tightenings of the muscles in the womb (contractions) that usually go away with rest, changing position, or drinking water. These are called Braxton Hicks contractions. Contractions may last for hours, days, or even weeks before true labor sets in. If contractions come at regular intervals, become more frequent, increase in intensity, or become painful, you should see your health care provider. What are the signs of labor?  Abdominal cramps.  Regular contractions that start at 10 minutes apart and become stronger and more frequent with time.  Contractions that start on the top of the uterus and spread down to the lower abdomen and back.  Increased pelvic pressure and dull back pain.  A watery or bloody mucus discharge that comes from the vagina.  Leaking of amniotic fluid. This is also known as your "water breaking." It could be a slow trickle or a gush. Let your health care provider know if it has a color or strange odor. If you have any of these signs, call your health care provider right away, even if it is before your due date. Follow these instructions at home: Medicines  Follow your health care provider's instructions regarding medicine use. Specific medicines may be either safe or unsafe to take during pregnancy.  Take a prenatal vitamin that contains at least 600 micrograms (mcg) of folic acid.  If you develop constipation, try taking a stool softener if your health care provider approves. Eating and drinking  Eat a balanced diet that includes fresh fruits and vegetables, whole grains, good sources of protein  such as meat, eggs, or tofu, and low-fat dairy. Your health care provider will help you determine the amount of weight gain that is right for you.  Avoid raw meat and uncooked cheese. These carry germs that can cause birth defects in the baby.  If you have low calcium intake from food, talk to your health care provider about whether you should take a daily calcium supplement.  Eat four or five small meals rather than three large meals a day.  Limit foods that are high in fat and processed sugars, such as fried and sweet foods.  To prevent constipation: ? Drink enough fluid to keep your urine clear or pale yellow. ? Eat foods that are high in fiber, such as fresh fruits and vegetables, whole grains, and beans. Activity  Exercise only as directed by your health care provider. Most women can continue their usual exercise routine during pregnancy. Try to exercise for 30 minutes at least 5 days a week. Stop exercising if you experience uterine contractions.  Avoid heavy   lifting.  Do not exercise in extreme heat or humidity, or at high altitudes.  Wear low-heel, comfortable shoes.  Practice good posture.  You may continue to have sex unless your health care provider tells you otherwise. Relieving pain and discomfort  Take frequent breaks and rest with your legs elevated if you have leg cramps or low back pain.  Take warm sitz baths to soothe any pain or discomfort caused by hemorrhoids. Use hemorrhoid cream if your health care provider approves.  Wear a good support bra to prevent discomfort from breast tenderness.  If you develop varicose veins: ? Wear support pantyhose or compression stockings as told by your healthcare provider. ? Elevate your feet for 15 minutes, 3-4 times a day. Prenatal care  Write down your questions. Take them to your prenatal visits.  Keep all your prenatal visits as told by your health care provider. This is important. Safety  Wear your seat belt at  all times when driving.  Make a list of emergency phone numbers, including numbers for family, friends, the hospital, and police and fire departments. General instructions  Avoid cat litter boxes and soil used by cats. These carry germs that can cause birth defects in the baby. If you have a cat, ask someone to clean the litter box for you.  Do not travel far distances unless it is absolutely necessary and only with the approval of your health care provider.  Do not use hot tubs, steam rooms, or saunas.  Do not drink alcohol.  Do not use any products that contain nicotine or tobacco, such as cigarettes and e-cigarettes. If you need help quitting, ask your health care provider.  Do not use any medicinal herbs or unprescribed drugs. These chemicals affect the formation and growth of the baby.  Do not douche or use tampons or scented sanitary pads.  Do not cross your legs for long periods of time.  To prepare for the arrival of your baby: ? Take prenatal classes to understand, practice, and ask questions about labor and delivery. ? Make a trial run to the hospital. ? Visit the hospital and tour the maternity area. ? Arrange for maternity or paternity leave through employers. ? Arrange for family and friends to take care of pets while you are in the hospital. ? Purchase a rear-facing car seat and make sure you know how to install it in your car. ? Pack your hospital bag. ? Prepare the baby's nursery. Make sure to remove all pillows and stuffed animals from the baby's crib to prevent suffocation.  Visit your dentist if you have not gone during your pregnancy. Use a soft toothbrush to brush your teeth and be gentle when you floss. Contact a health care provider if:  You are unsure if you are in labor or if your water has broken.  You become dizzy.  You have mild pelvic cramps, pelvic pressure, or nagging pain in your abdominal area.  You have lower back pain.  You have persistent  nausea, vomiting, or diarrhea.  You have an unusual or bad smelling vaginal discharge.  You have pain when you urinate. Get help right away if:  Your water breaks before 37 weeks.  You have regular contractions less than 5 minutes apart before 37 weeks.  You have a fever.  You are leaking fluid from your vagina.  You have spotting or bleeding from your vagina.  You have severe abdominal pain or cramping.  You have rapid weight loss or weight gain.    You have shortness of breath with chest pain.  You notice sudden or extreme swelling of your face, hands, ankles, feet, or legs.  Your baby makes fewer than 10 movements in 2 hours.  You have severe headaches that do not go away when you take medicine.  You have vision changes. Summary  The third trimester is from week 28 through week 40, months 7 through 9. The third trimester is a time when the unborn baby (fetus) is growing rapidly.  During the third trimester, your discomfort may increase as you and your baby continue to gain weight. You may have abdominal, leg, and back pain, sleeping problems, and an increased need to urinate.  During the third trimester your breasts will keep growing and they will continue to become tender. A yellow fluid (colostrum) may leak from your breasts. This is the first milk you are producing for your baby.  False labor is a condition in which you feel small, irregular tightenings of the muscles in the womb (contractions) that eventually go away. These are called Braxton Hicks contractions. Contractions may last for hours, days, or even weeks before true labor sets in.  Signs of labor can include: abdominal cramps; regular contractions that start at 10 minutes apart and become stronger and more frequent with time; watery or bloody mucus discharge that comes from the vagina; increased pelvic pressure and dull back pain; and leaking of amniotic fluid. This information is not intended to replace advice  given to you by your health care provider. Make sure you discuss any questions you have with your health care provider. Document Released: 11/20/2001 Document Revised: 05/03/2016 Document Reviewed: 01/27/2013 Elsevier Interactive Patient Education  2017 [employer]  

## 2018-11-04 NOTE — Progress Notes (Signed)
    Routine Prenatal Care Visit  Subjective  Michelle Bates is a 27 y.o. G3P1011 at 246w6d being seen today for ongoing prenatal care.  She is currently monitored for the following issues for this low-risk pregnancy and has Supervision of other normal pregnancy, antepartum on their problem list.  ----------------------------------------------------------------------------------- Patient reports heartburn.   Contractions: Not present. Vag. Bleeding: None.  Movement: Present. No leaking of fluid.  ----------------------------------------------------------------------------------- The following portions of the patient's history were reviewed and updated as appropriate: allergies, current medications, past family history, past medical history, past social history, past surgical history and problem list. Problem list updated.  Objective  Blood pressure 130/80, weight 220 lb (99.8 kg), last menstrual period 03/20/201. Pregravid weight 201 lb (91.2 kg) Total Weight Gain 19 lb (8.618 kg) Body mass index is 37.76 kg/m.   Urinalysis: Protein Negative, Glucose Negative  Fetal Status: Fetal Heart Rate (bpm): 134 Fundal Height: 34 cm Movement: Present     General:  Alert, oriented and cooperative. Patient is in no acute distress.  Skin: Skin is warm and dry. No rash noted.   Cardiovascular: Normal heart rate noted  Respiratory: Normal respiratory effort, no problems with respiration noted  Abdomen: Soft, gravid, appropriate for gestational age. Pain/Pressure: Present     Pelvic:  Cervical exam deferred        Extremities: Normal range of motion.     Mental Status: Normal mood and affect. Normal behavior. Normal judgment and thought content.    Assessment   27 y.o. G3P1011 at 2446w6d, EDD 12/03/2018 by Ultrasound presenting for a routine prenatal visit.  Plan   pregnancy #3 Problems (from 05/12/18 to present)    Problem Noted Resolved   Supervision of other normal pregnancy, antepartum  08/12/2018 by Vena AustriaStaebler, Andreas, MD No   Overview Addendum 10/21/2018  8:48 AM by Natale MilchSchuman, Christanna R, MD    [facility] Prenatal Labs  Dating 10 week US Blood type: O/Positive/-- (06/10 1121)   Genetic Screen NIPS: Normal XX Antibody:Negative (06/10 1121)  Anatomic US Incomplete cardiac views DP normal  Rubella: 3.14 (06/10 1121) Varicella: Immune  GTT 97 RPR: Non Reactive (06/10 1121)   Rhogam N/A HBsAg: Negative (06/10 1121)   TDaP vaccine  10/07/18                      Flu Shot: Declines HIV: Non Reactive (06/10 1121)   Baby Food     Breast                           GBS:   Contraception  Tubal, consents signed 09/09/18 Pap: 12/13/2017 NIL HPV negative  CBB   Pelvis tested to 6lbs 4oz  CS/VBAC    Support Person               Deferred GBS swab to next week.  Recommended OTC famotidine or ranitidine for heartburn and reflux.  Preterm labor symptoms and general obstetric precautions including but not limited to vaginal bleeding, contractions, leaking of fluid and fetal movement were reviewed.  Please refer to After Visit Summary for other counseling recommendations.   Return in about 1 week (around 11/11/2018) for ROB.  Marcelyn BruinsJacelyn Schmid, CNM 11/04/2018  8:59 AM

## 2018-11-12 ENCOUNTER — Ambulatory Visit (INDEPENDENT_AMBULATORY_CARE_PROVIDER_SITE_OTHER): Payer: Managed Care, Other (non HMO) | Admitting: Advanced Practice Midwife

## 2018-11-12 ENCOUNTER — Other Ambulatory Visit (HOSPITAL_COMMUNITY)
Admission: RE | Admit: 2018-11-12 | Discharge: 2018-11-12 | Disposition: A | Discharge status: Home / Self Care | Payer: Managed Care, Other (non HMO) | Source: Ambulatory Visit | Attending: Advanced Practice Midwife | Admitting: Advanced Practice Midwife

## 2018-11-12 ENCOUNTER — Encounter: Payer: Self-pay | Admitting: Advanced Practice Midwife

## 2018-11-12 VITALS — BP 120/80 | Wt 222.0 lb

## 2018-11-12 DIAGNOSIS — Z3A37 37 weeks gestation of pregnancy: Secondary | ICD-10-CM | POA: Diagnosis present

## 2018-11-12 DIAGNOSIS — Z113 Encounter for screening for infections with a predominantly sexual mode of transmission: Secondary | ICD-10-CM | POA: Insufficient documentation

## 2018-11-12 DIAGNOSIS — Z3685 Encounter for antenatal screening for Streptococcus B: Secondary | ICD-10-CM

## 2018-11-12 LAB — POCT URINALYSIS DIPSTICK OB
GLUCOSE, UA: NEGATIVE
PROTEIN: NEGATIVE

## 2018-11-12 NOTE — Progress Notes (Signed)
  Routine Prenatal Care Visit  Subjective  Michelle Bates is a 27 y.o. G3P1011 at 6557w0d being seen today for ongoing prenatal care.  She is currently monitored for the following issues for this low-risk pregnancy and has Supervision of other normal pregnancy, antepartum on their problem list.  ----------------------------------------------------------------------------------- Patient reports no complaints.   Contractions: Not present. Vag. Bleeding: None.  Movement: Present. Denies leaking of fluid.  ----------------------------------------------------------------------------------- The following portions of the patient's history were reviewed and updated as appropriate: allergies, current medications, past family history, past medical history, past social history, past surgical history and problem list. Problem list updated.   Objective  Blood pressure 120/80, weight 222 lb (100.7 kg), last menstrual period 02/26/2018 Pregravid weight 201 lb (91.2 kg) Total Weight Gain 21 lb (9.526 kg) Urinalysis: Urine Protein    Urine Glucose    Fetal Status: Fetal Heart Rate (bpm): 127 Fundal Height: 38 cm Movement: Present     General:  Alert, oriented and cooperative. Patient is in no acute distress.  Skin: Skin is warm and dry. No rash noted.   Cardiovascular: Normal heart rate noted  Respiratory: Normal respiratory effort, no problems with respiration noted  Abdomen: Soft, gravid, appropriate for gestational age. Pain/Pressure: Present     Pelvic:  GBS and aptima collected        Extremities: Normal range of motion.  Edema: None  Mental Status: Normal mood and affect. Normal behavior. Normal judgment and thought content.   Assessment   27 y.o. G3P1011 at 5457w0d by  12/03/2018, by Ultrasound presenting for routine prenatal visit  Plan   pregnancy #3 Problems (from 05/12/18 to present)    Problem Noted Resolved   Supervision of other normal pregnancy, antepartum 08/12/2018 by Vena AustriaStaebler,  Andreas, MD No   Overview Addendum 10/21/2018  8:48 AM by Natale MilchSchuman, Christanna R, MD    [facility]inic [facility] Prenatal Labs  Dating 10 week US Blood type: O/Positive/-- (06/10 1121)   Genetic Screen NIPS: Normal XX Antibody:Negative (06/10 1121)  Anatomic US Incomplete cardiac views DP normal  Rubella: 3.14 (06/10 1121) Varicella: Immune  GTT 97 RPR: Non Reactive (06/10 1121)   Rhogam N/A HBsAg: Negative (06/10 1121)   TDaP vaccine  10/07/18                      Flu Shot: Declines HIV: Non Reactive (06/10 1121)   Baby Food     Breast                           GBS:   Contraception  Tubal, consents signed 09/09/18 Pap: 12/13/2017 NIL HPV negative  CBB   Pelvis tested to 6lbs 4oz  CS/VBAC    Support Person                Preterm labor symptoms and general obstetric precautions including but not limited to vaginal bleeding, contractions, leaking of fluid and fetal movement were reviewed in detail with the patient. Please refer to After Visit Summary for other counseling recommendations.   Return in about 1 week (around 11/19/2018) for rob.  Tresea MallJane Analeese Andreatta, CNM 11/12/2018 11:54 AM

## 2018-11-12 NOTE — Addendum Note (Signed)
Addended by: Cornelius MorasPATTERSON, Mak Bonny D on: 11/12/2018 12:01 PM   Modules accepted: Orders

## 2018-11-12 NOTE — Patient Instructions (Signed)
Vaginal Delivery Vaginal delivery means that you will give birth by pushing your baby out of your birth canal (vagina). A team of health care providers will help you before, during, and after vaginal delivery. Birth experiences are unique for every woman and every pregnancy, and birth experiences vary depending on where you choose to give birth. What should I do to prepare for my baby's birth? Before your baby is born, it is important to talk with your health care provider about:  Your labor and delivery preferences. These may include: ? Medicines that you may be given. ? How you will manage your pain. This might include non-medical pain relief techniques or injectable pain relief such as epidural analgesia. ? How you and your baby will be monitored during labor and delivery. ? Who may be in the labor and delivery room with you. ? Your feelings about surgical delivery of your baby (cesarean delivery, or C-section) if this becomes necessary. ? Your feelings about receiving donated blood through an IV tube (blood transfusion) if this becomes necessary.  Whether you are able: ? To take pictures or videos of the birth. ? To eat during labor and delivery. ? To move around, walk, or change positions during labor and delivery.  What to expect after your baby is born, such as: ? Whether delayed umbilical cord clamping and cutting is offered. ? Who will care for your baby right after birth. ? Medicines or tests that may be recommended for your baby. ? Whether breastfeeding is supported in your hospital or birth center. ? How long you will be in the hospital or birth center.  How any medical conditions you have may affect your baby or your labor and delivery experience.  To prepare for your baby's birth, you should also:  Attend all of your health care visits before delivery (prenatal visits) as recommended by your health care provider. This is important.  Prepare your home for your baby's  arrival. Make sure that you have: ? Diapers. ? Baby clothing. ? Feeding equipment. ? Safe sleeping arrangements for you and your baby.  Install a car seat in your vehicle. Have your car seat checked by a certified car seat installer to make sure that it is installed safely.  Think about who will help you with your new baby at home for at least the first several weeks after delivery.  What can I expect when I arrive at the birth center or hospital? Once you are in labor and have been admitted into the hospital or birth center, your health care provider may:  Review your pregnancy history and any concerns you have.  Insert an IV tube into one of your veins. This is used to give you fluids and medicines.  Check your blood pressure, pulse, temperature, and heart rate (vital signs).  Check whether your bag of water (amniotic sac) has broken (ruptured).  Talk with you about your birth plan and discuss pain control options.  Monitoring Your health care provider may monitor your contractions (uterine monitoring) and your baby's heart rate (fetal monitoring). You may need to be monitored:  Often, but not continuously (intermittently).  All the time or for long periods at a time (continuously). Continuous monitoring may be needed if: ? You are taking certain medicines, such as medicine to relieve pain or make your contractions stronger. ? You have pregnancy or labor complications.  Monitoring may be done by:  Placing a special stethoscope or a handheld monitoring device on your abdomen to   check your baby's heartbeat, and feeling your abdomen for contractions. This method of monitoring does not continuously record your baby's heartbeat or your contractions.  Placing monitors on your abdomen (external monitors) to record your baby's heartbeat and the frequency and length of contractions. You may not have to wear external monitors all the time.  Placing monitors inside of your uterus  (internal monitors) to record your baby's heartbeat and the frequency, length, and strength of your contractions. ? Your health care provider may use internal monitors if he or she needs more information about the strength of your contractions or your baby's heart rate. ? Internal monitors are put in place by passing a thin, flexible wire through your vagina and into your uterus. Depending on the type of monitor, it may remain in your uterus or on your baby's head until birth. ? Your health care provider will discuss the benefits and risks of internal monitoring with you and will ask for your permission before inserting the monitors.  Telemetry. This is a type of continuous monitoring that can be done with external or internal monitors. Instead of having to stay in bed, you are able to move around during telemetry. Ask your health care provider if telemetry is an option for you.  Physical exam Your health care provider may perform a physical exam. This may include:  Checking whether your baby is positioned: ? With the head toward your vagina (head-down). This is most common. ? With the head toward the top of your uterus (head-up or breech). If your baby is in a breech position, your health care provider may try to turn your baby to a head-down position so you can deliver vaginally. If it does not seem that your baby can be born vaginally, your provider may recommend surgery to deliver your baby. In rare cases, you may be able to deliver vaginally if your baby is head-up (breech delivery). ? Lying sideways (transverse). Babies that are lying sideways cannot be delivered vaginally.  Checking your cervix to determine: ? Whether it is thinning out (effacing). ? Whether it is opening up (dilating). ? How low your baby has moved into your birth canal.  What are the three stages of labor and delivery?  Normal labor and delivery is divided into the following three stages: Stage 1  Stage 1 is the  longest stage of labor, and it can last for hours or days. Stage 1 includes: ? Early labor. This is when contractions may be irregular, or regular and mild. Generally, early labor contractions are more than 10 minutes apart. ? Active labor. This is when contractions get longer, more regular, more frequent, and more intense. ? The transition phase. This is when contractions happen very close together, are very intense, and may last longer than during any other part of labor.  Contractions generally feel mild, infrequent, and irregular at first. They get stronger, more frequent (about every 2-3 minutes), and more regular as you progress from early labor through active labor and transition.  Many women progress through stage 1 naturally, but you may need help to continue making progress. If this happens, your health care provider may talk with you about: ? Rupturing your amniotic sac if it has not ruptured yet. ? Giving you medicine to help make your contractions stronger and more frequent.  Stage 1 ends when your cervix is completely dilated to 4 inches (10 cm) and completely effaced. This happens at the end of the transition phase. Stage 2  Once   your cervix is completely effaced and dilated to 4 inches (10 cm), you may start to feel an urge to push. It is common for the body to naturally take a rest before feeling the urge to push, especially if you received an epidural or certain other pain medicines. This rest period may last for up to 1-2 hours, depending on your unique labor experience.  During stage 2, contractions are generally less painful, because pushing helps relieve contraction pain. Instead of contraction pain, you may feel stretching and burning pain, especially when the widest part of your baby's head passes through the vaginal opening (crowning).  Your health care provider will closely monitor your pushing progress and your baby's progress through the vagina during stage 2.  Your  health care provider may massage the area of skin between your vaginal opening and anus (perineum) or apply warm compresses to your perineum. This helps it stretch as the baby's head starts to crown, which can help prevent perineal tearing. ? In some cases, an incision may be made in your perineum (episiotomy) to allow the baby to pass through the vaginal opening. An episiotomy helps to make the opening of the vagina larger to allow more room for the baby to fit through.  It is very important to breathe and focus so your health care provider can control the delivery of your baby's head. Your health care provider may have you decrease the intensity of your pushing, to help prevent perineal tearing.  After delivery of your baby's head, the shoulders and the rest of the body generally deliver very quickly and without difficulty.  Once your baby is delivered, the umbilical cord may be cut right away, or this may be delayed for 1-2 minutes, depending on your baby's health. This may vary among health care providers, hospitals, and birth centers.  If you and your baby are healthy enough, your baby may be placed on your chest or abdomen to help maintain the baby's temperature and to help you bond with each other. Some mothers and babies start breastfeeding at this time. Your health care team will dry your baby and help keep your baby warm during this time.  Your baby may need immediate care if he or she: ? Showed signs of distress during labor. ? Has a medical condition. ? Was born too early (prematurely). ? Had a bowel movement before birth (meconium). ? Shows signs of difficulty transitioning from being inside the uterus to being outside of the uterus. If you are planning to breastfeed, your health care team will help you begin a feeding. Stage 3  The third stage of labor starts immediately after the birth of your baby and ends after you deliver the placenta. The placenta is an organ that develops  during pregnancy to provide oxygen and nutrients to your baby in the womb.  Delivering the placenta may require some pushing, and you may have mild contractions. Breastfeeding can stimulate contractions to help you deliver the placenta.  After the placenta is delivered, your uterus should tighten (contract) and become firm. This helps to stop bleeding in your uterus. To help your uterus contract and to control bleeding, your health care provider may: ? Give you medicine by injection, through an IV tube, by mouth, or through your rectum (rectally). ? Massage your abdomen or perform a vaginal exam to remove any blood clots that are left in your uterus. ? Empty your bladder by placing a thin, flexible tube (catheter) into your bladder. ? Encourage   you to breastfeed your baby. After labor is over, you and your baby will be monitored closely to ensure that you are both healthy until you are ready to go home. Your health care team will teach you how to care for yourself and your baby. This information is not intended to replace advice given to you by your health care provider. Make sure you discuss any questions you have with your health care provider. Document Released: 09/04/2008 Document Revised: 06/15/2016 Document Reviewed: 12/11/2015 Elsevier Interactive Patient Education  2018 [employer]  

## 2018-11-14 LAB — STREP GP B NAA: Strep Gp B NAA: NEGATIVE

## 2018-11-14 LAB — CERVICOVAGINAL ANCILLARY ONLY
Chlamydia: NEGATIVE
Neisseria Gonorrhea: NEGATIVE

## 2018-11-19 ENCOUNTER — Encounter: Payer: Self-pay | Admitting: Obstetrics and Gynecology

## 2018-11-19 ENCOUNTER — Ambulatory Visit (INDEPENDENT_AMBULATORY_CARE_PROVIDER_SITE_OTHER): Payer: Managed Care, Other (non HMO) | Admitting: Obstetrics and Gynecology

## 2018-11-19 VITALS — BP 120/80 | Wt 223.0 lb

## 2018-11-19 DIAGNOSIS — Z3A38 38 weeks gestation of pregnancy: Secondary | ICD-10-CM

## 2018-11-19 DIAGNOSIS — Z3483 Encounter for supervision of other normal pregnancy, third trimester: Secondary | ICD-10-CM

## 2018-11-19 DIAGNOSIS — Z3480 Encounter for supervision of other normal pregnancy, unspecified trimester: Secondary | ICD-10-CM

## 2018-11-19 NOTE — Progress Notes (Signed)
  Routine Prenatal Care Visit  Subjective  Michelle Bates is a 27 y.o. G3P1011 at 8086w0d being seen today for ongoing prenatal care.  She is currently monitored for the following issues for this low-risk pregnancy and has Supervision of other normal pregnancy, antepartum on their problem list.  ----------------------------------------------------------------------------------- Patient reports no complaints.   Contractions: Not present. Vag. Bleeding: None.  Movement: Present. Denies leaking of fluid.  ----------------------------------------------------------------------------------- The following portions of the patient's history were reviewed and updated as appropriate: allergies, current medications, past family history, past medical history, past social history, past surgical history and problem list. Problem list updated.   Objective  Blood pressure 120/80, weight 223 lb (101.2 kg), last menstrual period 02/26/2018, unknown if currently breastfeeding. Pregravid weight 201 lb (91.2 kg) Total Weight Gain 22 lb (9.979 kg) Urinalysis: Urine Protein    Urine Glucose    Fetal Status: Fetal Heart Rate (bpm): 130 Fundal Height: 38 cm Movement: Present     General:  Alert, oriented and cooperative. Patient is in no acute distress.  Skin: Skin is warm and dry. No rash noted.   Cardiovascular: Normal heart rate noted  Respiratory: Normal respiratory effort, no problems with respiration noted  Abdomen: Soft, gravid, appropriate for gestational age. Pain/Pressure: Present     Pelvic:  Cervical exam deferred        Extremities: Normal range of motion.  Edema: None  Mental Status: Normal mood and affect. Normal behavior. Normal judgment and thought content.   Assessment   27 y.o. G3P1011 at 1586w0d by  12/03/2018, by Ultrasound presenting for routine prenatal visit  Plan   pregnancy #3 Problems (from 05/12/18 to present)    Problem Noted Resolved   Supervision of other normal pregnancy,  antepartum 08/12/2018 by Vena AustriaStaebler, Andreas, MD No   Overview Addendum 10/21/2018  8:48 AM by Natale MilchSchuman, Christanna R, MD    [facility]inic [facility] Prenatal Labs  Dating 10 week US Blood type: O/Positive/-- (06/10 1121)   Genetic Screen NIPS: Normal XX Antibody:Negative (06/10 1121)  Anatomic US Incomplete cardiac views DP normal  Rubella: 3.14 (06/10 1121) Varicella: Immune  GTT 97 RPR: Non Reactive (06/10 1121)   Rhogam N/A HBsAg: Negative (06/10 1121)   TDaP vaccine  10/07/18                      Flu Shot: Declines HIV: Non Reactive (06/10 1121)   Baby Food     Breast                           GBS:   Contraception  Tubal, consents signed 09/09/18 Pap: 12/13/2017 NIL HPV negative  CBB   Pelvis tested to 6lbs 4oz  CS/VBAC    Support Person                Term labor symptoms and general obstetric precautions including but not limited to vaginal bleeding, contractions, leaking of fluid and fetal movement were reviewed in detail with the patient. Please refer to After Visit Summary for other counseling recommendations.   Return in about 1 week (around 11/26/2018) for Routine Prenatal Appointment.  Thomasene MohairStephen Clarence Cogswell, MD, Merlinda FrederickFACOG [facility], [facility] 11/19/2018 3:41 PM

## 2018-11-27 ENCOUNTER — Encounter: Payer: Managed Care, Other (non HMO) | Admitting: Advanced Practice Midwife

## 2018-11-28 ENCOUNTER — Encounter: Payer: Self-pay | Admitting: Obstetrics and Gynecology

## 2018-11-28 ENCOUNTER — Ambulatory Visit (INDEPENDENT_AMBULATORY_CARE_PROVIDER_SITE_OTHER): Payer: Managed Care, Other (non HMO) | Admitting: Obstetrics and Gynecology

## 2018-11-28 VITALS — BP 112/70 | Wt 223.0 lb

## 2018-11-28 DIAGNOSIS — Z3A39 39 weeks gestation of pregnancy: Secondary | ICD-10-CM

## 2018-11-28 DIAGNOSIS — O368130 Decreased fetal movements, third trimester, not applicable or unspecified: Secondary | ICD-10-CM | POA: Diagnosis not present

## 2018-11-28 DIAGNOSIS — Z3480 Encounter for supervision of other normal pregnancy, unspecified trimester: Secondary | ICD-10-CM

## 2018-11-28 NOTE — Progress Notes (Signed)
ROB Some contractions, denies lof, some pelvic pressure, FM less than last week  Desires cervical check no Membrane sweep

## 2018-11-28 NOTE — Progress Notes (Signed)
    Routine Prenatal Care Visit  Subjective  Michelle Bates is a 27 y.o. G3P1011 at 3149w2d being seen today for ongoing prenatal care.  She is currently monitored for the following issues for this low-risk pregnancy and has Supervision of other normal pregnancy, antepartum on their problem list.  ----------------------------------------------------------------------------------- Patient reports decreased fetal movements with less than 5 in an hour. She has nto had lunch today though and it is 3:30. .   Contractions: Not present. Vag. Bleeding: None.  Movement: (!) Decreased. Denies leaking of fluid.  ----------------------------------------------------------------------------------- The following portions of the patient's history were reviewed and updated as appropriate: allergies, current medications, past family history, past medical history, past social history, past surgical history and problem list. Problem list updated.   Objective  Blood pressure 112/70, weight 223 lb (101.2 kg), last menstrual period 02/26/2018, unknown if currently breastfeeding. Pregravid weight 201 lb (91.2 kg) Total Weight Gain 22 lb (9.979 kg) Urinalysis:      Fetal Status: Fetal Heart Rate (bpm): 120 Fundal Height: 39 cm Movement: (!) Decreased     General:  Alert, oriented and cooperative. Patient is in no acute distress.  Skin: Skin is warm and dry. No rash noted.   Cardiovascular: Normal heart rate noted  Respiratory: Normal respiratory effort, no problems with respiration noted  Abdomen: Soft, gravid, appropriate for gestational age. Pain/Pressure: Absent     Pelvic:  Cervical exam performed Dilation: 1.5 Effacement (%): 50 Station: -3  Extremities: Normal range of motion.  Edema: None  Mental Status: Normal mood and affect. Normal behavior. Normal judgment and thought content.   NST: 120 bpm baseline, moderate variability, 15x15 accelerations, no decelerations.   Assessment   27 y.o. G3P1011 at  7249w2d by  12/03/2018, by Ultrasound presenting for routine prenatal visit  Plan   pregnancy #3 Problems (from 05/12/18 to present)    Problem Noted Resolved   Supervision of other normal pregnancy, antepartum 08/12/2018 by Vena AustriaStaebler, Andreas, MD No   Overview Addendum 10/21/2018  8:48 AM by Natale MilchSchuman, Mandela Bello R, MD    Clinic [facility] Prenatal Labs  Dating 10 week US Blood type: O/Positive/-- (06/10 1121)   Genetic Screen NIPS: Normal XX Antibody:Negative (06/10 1121)  Anatomic US Incomplete cardiac views DP normal  Rubella: 3.14 (06/10 1121) Varicella: Immune  GTT 97 RPR: Non Reactive (06/10 1121)   Rhogam N/A HBsAg: Negative (06/10 1121)   TDaP vaccine  10/07/18                      Flu Shot: Declines HIV: Non Reactive (06/10 1121)   Baby Food     Breast                           GBS:   Contraception  Tubal, consents signed 09/09/18 Pap: 12/13/2017 NIL HPV negative  CBB   Pelvis tested to 6lbs 4oz  CS/VBAC    Support Person                Gestational age appropriate obstetric precautions including but not limited to vaginal bleeding, contractions, leaking of fluid and fetal movement were reviewed in detail with the patient.    Return in about 1 week (around 12/05/2018) for ROB.  Natale Milchhristanna R Brynnlee Cumpian MD [facility], [facility] 11/28/2018, 3:29 PM

## 2018-12-03 ENCOUNTER — Inpatient Hospital Stay: Payer: Managed Care, Other (non HMO) | Admitting: Anesthesiology

## 2018-12-03 ENCOUNTER — Other Ambulatory Visit: Payer: Self-pay

## 2018-12-03 ENCOUNTER — Inpatient Hospital Stay
Admission: EM | Admit: 2018-12-03 | Discharge: 2018-12-05 | DRG: 797 | Disposition: A | Discharge status: Home / Self Care | Payer: Managed Care, Other (non HMO) | Attending: Obstetrics & Gynecology | Admitting: Obstetrics & Gynecology

## 2018-12-03 DIAGNOSIS — Z3A40 40 weeks gestation of pregnancy: Secondary | ICD-10-CM

## 2018-12-03 DIAGNOSIS — Z3483 Encounter for supervision of other normal pregnancy, third trimester: Secondary | ICD-10-CM | POA: Diagnosis present

## 2018-12-03 DIAGNOSIS — O9081 Anemia of the puerperium: Secondary | ICD-10-CM | POA: Diagnosis not present

## 2018-12-03 DIAGNOSIS — O480 Post-term pregnancy: Secondary | ICD-10-CM | POA: Diagnosis not present

## 2018-12-03 DIAGNOSIS — Z3480 Encounter for supervision of other normal pregnancy, unspecified trimester: Secondary | ICD-10-CM

## 2018-12-03 DIAGNOSIS — D62 Acute posthemorrhagic anemia: Secondary | ICD-10-CM | POA: Diagnosis not present

## 2018-12-03 DIAGNOSIS — Z302 Encounter for sterilization: Secondary | ICD-10-CM

## 2018-12-03 DIAGNOSIS — Z113 Encounter for screening for infections with a predominantly sexual mode of transmission: Secondary | ICD-10-CM

## 2018-12-03 HISTORY — DX: Other specified health status: Z78.9

## 2018-12-03 LAB — CBC
HCT: 38.3 % (ref 36.0–46.0)
Hemoglobin: 12.5 g/dL (ref 12.0–15.0)
MCH: 25.7 pg — ABNORMAL LOW (ref 26.0–34.0)
MCHC: 32.6 g/dL (ref 30.0–36.0)
MCV: 78.8 fL — ABNORMAL LOW (ref 80.0–100.0)
NRBC: 0.2 % (ref 0.0–0.2)
PLATELETS: 261 10*3/uL (ref 150–400)
RBC: 4.86 MIL/uL (ref 3.87–5.11)
RDW: 14.2 % (ref 11.5–15.5)
WBC: 8.2 10*3/uL (ref 4.0–10.5)

## 2018-12-03 LAB — TYPE AND SCREEN
ABO/RH(D): O POS
Antibody Screen: NEGATIVE

## 2018-12-03 MED ORDER — LIDOCAINE HCL (PF) 1 % IJ SOLN
INTRAMUSCULAR | Status: DC | PRN
  Administered 2018-12-03: 4 mL via INTRADERMAL

## 2018-12-03 MED ORDER — LIDOCAINE-EPINEPHRINE (PF) 1.5 %-1:200000 IJ SOLN
INTRAMUSCULAR | Status: DC | PRN
  Administered 2018-12-03: 4 mL via EPIDURAL

## 2018-12-03 MED ORDER — DIPHENHYDRAMINE HCL 50 MG/ML IJ SOLN: 12.5000 mg | INTRAMUSCULAR | Status: DC | PRN

## 2018-12-03 MED ORDER — TERBUTALINE SULFATE 1 MG/ML IJ SOLN
INTRAMUSCULAR | Status: AC
  Filled 2018-12-03: qty 1

## 2018-12-03 MED ORDER — FENTANYL 2.5 MCG/ML W/ROPIVACAINE 0.15% IN NS 100 ML EPIDURAL (ARMC)
EPIDURAL | Status: AC
  Filled 2018-12-03: qty 100

## 2018-12-03 MED ORDER — EPHEDRINE 5 MG/ML INJ
INTRAVENOUS | Status: AC
  Administered 2018-12-03: 10 mg via INTRAVENOUS
  Filled 2018-12-03: qty 4

## 2018-12-03 MED ORDER — EPHEDRINE 5 MG/ML INJ
10.0000 mg | INTRAVENOUS | Status: DC | PRN
  Administered 2018-12-03: 10 mg via INTRAVENOUS
  Filled 2018-12-03: qty 2
  Filled 2018-12-03: qty 4

## 2018-12-03 MED ORDER — PRENATAL MULTIVITAMIN CH: 1.0000 | ORAL_TABLET | Freq: Every day | ORAL | Status: DC

## 2018-12-03 MED ORDER — PHENYLEPHRINE 40 MCG/ML (10ML) SYRINGE FOR IV PUSH (FOR BLOOD PRESSURE SUPPORT)
80.0000 ug | PREFILLED_SYRINGE | INTRAVENOUS | Status: DC | PRN
  Filled 2018-12-03: qty 10

## 2018-12-03 MED ORDER — SODIUM CHLORIDE FLUSH 0.9 % IV SOLN
INTRAVENOUS | Status: AC
  Filled 2018-12-03: qty 10

## 2018-12-03 MED ORDER — DIBUCAINE 1 % RE OINT: 1.0000 "application " | TOPICAL_OINTMENT | RECTAL | Status: DC | PRN

## 2018-12-03 MED ORDER — LIDOCAINE HCL (PF) 1 % IJ SOLN: 30.0000 mL | INTRAMUSCULAR | Status: DC | PRN

## 2018-12-03 MED ORDER — SIMETHICONE 80 MG PO CHEW
80.0000 mg | CHEWABLE_TABLET | ORAL | Status: DC | PRN
  Administered 2018-12-05: 80 mg via ORAL
  Filled 2018-12-03: qty 1

## 2018-12-03 MED ORDER — FENTANYL 2.5 MCG/ML W/ROPIVACAINE 0.15% IN NS 100 ML EPIDURAL (ARMC): 12.0000 mL/h | EPIDURAL | Status: DC

## 2018-12-03 MED ORDER — TERBUTALINE SULFATE 1 MG/ML IJ SOLN: 0.2500 mg | Freq: Once | INTRAMUSCULAR | Status: DC | PRN

## 2018-12-03 MED ORDER — SENNOSIDES-DOCUSATE SODIUM 8.6-50 MG PO TABS
2.0000 | ORAL_TABLET | ORAL | Status: DC
  Filled 2018-12-03: qty 2

## 2018-12-03 MED ORDER — DIPHENHYDRAMINE HCL 25 MG PO CAPS: 25.0000 mg | ORAL_CAPSULE | Freq: Four times a day (QID) | ORAL | Status: DC | PRN

## 2018-12-03 MED ORDER — LACTATED RINGERS IV SOLN
500.0000 mL | Freq: Once | INTRAVENOUS | Status: AC
  Administered 2018-12-03: 500 mL via INTRAVENOUS

## 2018-12-03 MED ORDER — ACETAMINOPHEN 325 MG PO TABS
650.0000 mg | ORAL_TABLET | ORAL | Status: DC | PRN
  Administered 2018-12-03: 650 mg via ORAL
  Filled 2018-12-03: qty 2

## 2018-12-03 MED ORDER — ONDANSETRON HCL 4 MG PO TABS: 4.0000 mg | ORAL_TABLET | ORAL | Status: DC | PRN

## 2018-12-03 MED ORDER — OXYTOCIN 40 UNITS IN LACTATED RINGERS INFUSION - SIMPLE MED
2.5000 [IU]/h | INTRAVENOUS | Status: DC
  Administered 2018-12-03: 2.5 [IU]/h via INTRAVENOUS

## 2018-12-03 MED ORDER — IBUPROFEN 600 MG PO TABS
600.0000 mg | ORAL_TABLET | Freq: Four times a day (QID) | ORAL | Status: DC
  Administered 2018-12-03 – 2018-12-05 (×6): 600 mg via ORAL
  Filled 2018-12-03 (×6): qty 1

## 2018-12-03 MED ORDER — BUPIVACAINE HCL (PF) 0.25 % IJ SOLN
INTRAMUSCULAR | Status: DC | PRN
  Administered 2018-12-03 (×2): 4 mL via EPIDURAL

## 2018-12-03 MED ORDER — ONDANSETRON HCL 4 MG/2ML IJ SOLN
4.0000 mg | INTRAMUSCULAR | Status: DC | PRN
  Administered 2018-12-04: 4 mg via INTRAVENOUS
  Filled 2018-12-03: qty 2

## 2018-12-03 MED ORDER — BENZOCAINE-MENTHOL 20-0.5 % EX AERO
1.0000 "application " | INHALATION_SPRAY | CUTANEOUS | Status: DC | PRN
  Administered 2018-12-03: 1 via TOPICAL
  Filled 2018-12-03: qty 56

## 2018-12-03 MED ORDER — COCONUT OIL OIL: 1.0000 "application " | TOPICAL_OIL | Status: DC | PRN

## 2018-12-03 MED ORDER — EPHEDRINE 5 MG/ML INJ
10.0000 mg | INTRAVENOUS | Status: AC | PRN
  Administered 2018-12-03 (×2): 10 mg via INTRAVENOUS

## 2018-12-03 MED ORDER — OXYTOCIN 40 UNITS IN LACTATED RINGERS INFUSION - SIMPLE MED
1.0000 m[IU]/min | INTRAVENOUS | Status: DC
  Administered 2018-12-03: 2 m[IU]/min via INTRAVENOUS
  Filled 2018-12-03: qty 1000

## 2018-12-03 MED ORDER — BUTORPHANOL TARTRATE 1 MG/ML IJ SOLN: 1.0000 mg | INTRAMUSCULAR | Status: DC | PRN

## 2018-12-03 MED ORDER — ONDANSETRON HCL 4 MG/2ML IJ SOLN: 4.0000 mg | Freq: Four times a day (QID) | INTRAMUSCULAR | Status: DC | PRN

## 2018-12-03 MED ORDER — WITCH HAZEL-GLYCERIN EX PADS: 1.0000 "application " | MEDICATED_PAD | CUTANEOUS | Status: DC | PRN

## 2018-12-03 MED ORDER — LACTATED RINGERS IV SOLN
INTRAVENOUS | Status: DC
  Administered 2018-12-03 (×2): via INTRAVENOUS

## 2018-12-03 MED ORDER — OXYTOCIN 40 UNITS IN LACTATED RINGERS INFUSION - SIMPLE MED
INTRAVENOUS | Status: AC
  Administered 2018-12-03: 2.5 [IU]/h via INTRAVENOUS
  Filled 2018-12-03: qty 1000

## 2018-12-03 MED ORDER — LACTATED RINGERS IV SOLN
500.0000 mL | INTRAVENOUS | Status: DC | PRN
  Administered 2018-12-03: 500 mL via INTRAVENOUS

## 2018-12-03 MED ORDER — OXYTOCIN BOLUS FROM INFUSION: 500.0000 mL | Freq: Once | INTRAVENOUS | Status: DC

## 2018-12-03 NOTE — OB Triage Note (Signed)
Pt is a G3P1011 at 6648w0d that presents from ED c/o ctx every 3-1075min.SVE 6/70/BBOW. Initial FHT 125 with monitors applied and assessing.

## 2018-12-03 NOTE — Discharge Summary (Signed)
OB Discharge Summary     Patient Name: Michelle Bates DOB: [birth year] MRN: [id number]  Date of admission: 12/03/2018 Delivering provider: Tresea MallJane Gledhill, CNM  Date of Delivery: 12/03/2018  Date of discharge: 12/05/2018  Admitting diagnosis: contractions, active labor Intrauterine pregnancy: 6785w0d     Secondary diagnosis: Undesired fertility     Discharge diagnosis: Term Pregnancy Delivered  , pelvic girdle pain/ left hip pain                                                                                              Post partum procedures: postpartum tubal ligation  Augmentation: AROM and Pitocin  Complications: None  Hospital course:  Onset of Labor With Vaginal Delivery      27 y.o. yo G3P1011 at 3285w0d was admitted in Active Labor on 12/03/2018.  Patient had an uncomplicated labor course as follows:  Membrane Rupture Time/Date: 10:51 AM ,12/03/2018   Patient had delivery of viable female at 3:37 PM, 12/03/2018. See delivery note for details.  Patient had a postpartum course complicated by left hip pain and difficulty with ambulation due to pain. She had a PT consult on PPD #1 and was fitted with a SI compression belt and was given a walker to use.  On PPD#1 she also underwent a postpartum bilateral tubal ligation that was uncomplicated.  On PPD #2 she was ambulating better and needed a little assistance getting into bed. She is ambulating and voiding without difficulty. She is tolerating PO intake and her pain is well controlled with PO medications.   Patient is discharged home in stable condition on 12/05/2018   Physical exam  Vitals:   12/04/18 2010 12/05/18 0022 12/05/18 0428 12/05/18 0815  BP: 134/82 120/74 104/65 107/60  Pulse: 69 71 67 78  Resp: 18 16 18 18   Temp: (!) 97.5 F (36.4 C) 98 F (36.7 C) 97.9 F (36.6 C) 98 F (36.7 C)  TempSrc: Oral Oral Oral Oral  SpO2: 100% 100% 97% 98%  Weight:      Height:       General: alert, cooperative and no  distress Lochia: appropriate Uterine Fundus: firm at U/ ML/ tender due to incision Incision: C+D+I DVT Evaluation: No evidence of DVT seen on physical exam.  Labs: Lab Results  Component Value Date   WBC 10.5 12/04/2018   HGB 10.9 (L) 12/04/2018   HCT 33.7 (L) 12/04/2018   MCV 79.7 (L) 12/04/2018   PLT 202 12/04/2018    Discharge instruction: per After Visit Summary.  Medications:  Allergies as of 12/05/2018   No Known Allergies     Medication List    TAKE these medications   ibuprofen 600 MG tablet Commonly known as:  ADVIL,MOTRIN Take 1 tablet (600 mg total) by mouth every 6 (six) hours as needed for headache, mild pain or moderate pain.   oxyCODONE 5 MG immediate release tablet Commonly known as:  Oxy IR/ROXICODONE Take 1 tablet (5 mg total) by mouth every 6 (six) hours as needed for up to 5 days for moderate pain or severe pain.   prenatal multivitamin Tabs tablet Take 1 tablet  by mouth daily at 12 noon.       Diet: routine diet  Activity: Advance as tolerated. Pelvic rest for 6 weeks.   Outpatient follow up: Follow-up Information    Tresea MallGledhill, Jane, CNM. Schedule an appointment as soon as possible for a visit in 6 week(s).   Specialty:  Obstetrics Why:  postpartum follow up Contact information: [address] [place] [address] [phone]             Postpartum contraception: Tubal Ligation Rhogam Given postpartum: NA Rubella vaccine given postpartum: Rubella Immune Varicella vaccine given postpartum: Varicella Immune TDaP given antepartum or postpartum: given antepartum  Newborn Data: Live born female: Dionisio PaschalOlivia Londyn Birth Weight: 2960 g, 6 pounds 8 ounces  APGAR: 8, 9  Newborn Delivery   Birth date/time:  12/03/2018 15:37:00 Delivery type:  Vaginal, Spontaneous      Baby Feeding: Breastfeeding  Disposition:home with mother  SIGNED:  Farrel ConnersColleen Hager [place]ston, CNM 12/05/2018 12:53 PM

## 2018-12-03 NOTE — Progress Notes (Addendum)
  Labor Progress Note   27 y.o. [id number] @ 3428w0d , admitted for  Pregnancy, Labor Management.   Subjective:  Comfortable with epidural. BP dropped, ephedrine given with recovery to 90's over 50's and fetal heart rate deceleration to 90s/100s. AROM for clear. Attempted to reduce cervical lip with maternal effort pushing. Maternal bladder emptied. Fetal heart rate improvement but unable to reduce lip. Will labor down.   Objective:  BP (!) 98/51   Pulse (!) 101   Temp 97.9 F (36.6 C) (Oral)   Resp 15   Ht 5\' 2"  (1.575 m)   Wt 101.2 kg   LMP 02/26/2018 (LMP Unknown)   SpO2 99%   BMI 40.79 kg/m  Abd: mild Extr: trace to 1+ bilateral pedal edema SVE: CERVIX: anterior lip cm dilated/+2 station  EFM: FHR: 140 bpm, variability: moderate,  accelerations:  Present,  decelerations:  Present with good recovery Toco: Frequency: Every 2-3 minutes Labs: I have reviewed the patient's lab results.   Assessment & Plan:  G3P1011 @ 4328w0d, admitted for  Pregnancy and Labor/Delivery Management  1. Pain management: epidural. 2. FWB: FHT category II.  3. ID: GBS negative 4. Labor management: Labor down  All discussed with patient, see orders  Tresea MallJane Rmani Kapusta, CNM  [facility], [facility] 12/03/2018  11:22 AM

## 2018-12-03 NOTE — Anesthesia Procedure Notes (Signed)
Epidural Patient location during procedure: OB  Staffing Performed: anesthesiologist   Preanesthetic Checklist Completed: patient identified, site marked, surgical consent, pre-op evaluation, timeout performed, IV checked, risks and benefits discussed and monitors and equipment checked  Epidural Patient position: sitting Prep: Betadine Patient monitoring: heart rate, continuous pulse ox and blood pressure Approach: midline Location: L4-L5 Injection technique: LOR saline  Needle:  Needle type: Tuohy  Needle gauge: 17 G Needle length: 9 cm and 9 Needle insertion depth: 6 cm Catheter type: closed end flexible Catheter size: 19 Gauge Catheter at skin depth: 11 cm Test dose: negative and 1.5% lidocaine with Epi 1:200 K  Assessment Sensory level: T10 Events: blood not aspirated, injection not painful, no injection resistance, negative IV test and no paresthesia  Additional Notes   Patient tolerated the insertion well without complications.-SATD -IVTD. No paresthesia. Refer to [facility] nursing for VS and dosingReason for block:procedure for pain     

## 2018-12-03 NOTE — H&P (Signed)
OB History & Physical   History of Present Illness:  Chief Complaint: contractions  HPI:  Michelle Bates is a 27 y.o. 293P1011 female at 6214w0d dated by ultrasound.  Her pregnancy has been without complication.    She reports contractions that began earlier this morning every 3-5 munutes.   She denies leakage of fluid.   She denies vaginal bleeding.   She reports fetal movement.    Maternal Medical History:   Past Medical History:  Diagnosis Date  . Family history of breast cancer    cancer genetic testing letter sent 6/19  . Medical history non-contributory   . Obesity   . Tonsillitis     Past Surgical History:  Procedure Laterality Date  . NO PAST SURGERIES      No Known Allergies  Prior to Admission medications   Medication Sig Start Date End Date Taking? Authorizing Provider  Prenatal Vit-Fe Fumarate-FA (PRENATAL MULTIVITAMIN) TABS tablet Take 1 tablet by mouth daily at 12 noon.   Yes [provider]    OB History  Gravida Para Term Preterm AB Living  3 1 1   1 1   SAB TAB Ectopic Multiple Live Births  1       1    # Outcome Date GA Lbr Len/2nd Weight Sex Delivery Anes PTL Lv  3 Current           2 Term 09/26/14   2835 g M Vag-Spont   LIV  1 SAB             Prenatal care site: [facility]  Social History: She  reports that she has never smoked. She has never used smokeless tobacco. She reports that she does not drink alcohol or use drugs.  Family History: family history includes Brain cancer (age of onset: 7740) in her father; Breast cancer (age of onset: 3440) in her maternal aunt; Colon cancer (age of onset: 6540) in her mother; Prostate cancer (age of onset: 3559) in her father.    Review of Systems: Negative x 10 systems reviewed except as noted in the HPI.    Physical Exam:  Vital Signs: BP 135/85 (BP Location: Left Arm)   Pulse 86   Temp 97.9 F (36.6 C) (Oral)   Resp 15   Ht 5\' 2"  (1.575 m)   Wt 101.2 kg   LMP 02/26/2018 (LMP Unknown)   BMI  40.79 kg/m  Constitutional: Well nourished, well developed female in no acute distress.  HEENT: normal Skin: Warm and dry.  Cardiovascular: Regular rate and rhythm.   Extremity: no edema  Respiratory: Clear to auscultation bilateral. Normal respiratory effort Abdomen: FHT present Back: no CVAT Neuro: DTRs 2+, Cranial nerves grossly intact Psych: Alert and Oriented x3. No memory deficits. Normal mood and affect.  MS: normal gait, normal bilateral lower extremity ROM/strength/stability.  Pelvic exam: by RN: 6/70/-2 with BBOW   Pertinent Results:  Prenatal Labs: Blood type/Rh O positive  Antibody screen negative  Rubella Immune  Varicella Immune    RPR Non-reactive  HBsAg negative  HIV negative  GC negative  Chlamydia negative  Genetic screening negative  1 hour GTT 97  3 hour GTT NA  GBS negative on 12/4   Baseline FHR: 125 beats/min   Variability: moderate   Accelerations: present   Decelerations: absent Contractions: present frequency: every 2-3 minutes Overall assessment: reassuring   Assessment:  Michelle Bates is a 27 y.o. 733P1011 female at 10114w0d with active labor.   Plan:  1. Admit to Labor & Delivery  2. CBC, T&S, Clrs, IVF 3. GBS negative.   4. Fetal well-being: Category I 5. Expectant management for vaginal delivery 6. Patient desires epidural   Tresea MallJane Gurinder Toral, CNM 12/03/2018 10:30 AM

## 2018-12-03 NOTE — Progress Notes (Signed)
  Labor Progress Note   27 y.o. [id number] @ 1653w0d , admitted for  Pregnancy, Labor Management.   Subjective:  Comfortable with epidural. Feeling some rectal pressure. Pitocin was started to augment labor.   Objective:  BP 115/69 (BP Location: Right Arm)   Pulse 85   Temp (!) 97.5 F (36.4 C) (Oral)   Resp 17   Ht 5\' 2"  (1.575 m)   Wt 101.2 kg   LMP 02/26/2018 (LMP Unknown)   SpO2 99%   BMI 40.79 kg/m  Abd: mild Extr: trace to 1+ bilateral pedal edema SVE: CERVIX: persistent rim with some swelling, +2  EFM: FHR: 120 bpm, variability: moderate,  accelerations:  Present,  decelerations:  Present early Toco: Frequency: Every 2-4 minutes Labs: I have reviewed the patient's lab results.   Assessment & Plan:  G3P1011 @ 6653w0d, admitted for  Pregnancy and Labor/Delivery Management  1. Pain management: epidural. 2. FWB: FHT category II, overall reassuring.  3. ID: GBS negative 4. Labor management: continue to titrate pitocin to adequate, labor down  All discussed with patient, see orders   Tresea MallJane Declin Rajan, CNM [facility], [facility] 12/03/2018  2:44 PM

## 2018-12-03 NOTE — Anesthesia Preprocedure Evaluation (Signed)
Anesthesia Evaluation  Patient identified by MRN, date of birth, ID band Patient awake    Reviewed: Allergy & Precautions, H&P , NPO status , Patient's Chart, lab work & pertinent test results, reviewed documented beta blocker date and time   Airway Mallampati: II  TM Distance: >3 FB Neck ROM: full    Dental no notable dental hx. (+) Teeth Intact   Pulmonary neg pulmonary ROS, Current Smoker,    Pulmonary exam normal breath sounds clear to auscultation       Cardiovascular Exercise Tolerance: Good negative cardio ROS   Rhythm:regular Rate:Normal     Neuro/Psych negative neurological ROS  negative psych ROS   GI/Hepatic negative GI ROS, Neg liver ROS,   Endo/Other  negative endocrine ROSdiabetes, Well Controlled, Gestational  Renal/GU      Musculoskeletal   Abdominal   Peds  Hematology negative hematology ROS (+)   Anesthesia Other Findings   Reproductive/Obstetrics (+) Pregnancy                             Anesthesia Physical Anesthesia Plan  ASA: II  Anesthesia Plan: Epidural   Post-op Pain Management:    Induction:   PONV Risk Score and Plan:   Airway Management Planned:   Additional Equipment:   Intra-op Plan:   Post-operative Plan:   Informed Consent: I have reviewed the patients History and Physical, chart, labs and discussed the procedure including the risks, benefits and alternatives for the proposed anesthesia with the patient or authorized representative who has indicated his/her understanding and acceptance.       Plan Discussed with:   Anesthesia Plan Comments:         Anesthesia Quick Evaluation  

## 2018-12-04 ENCOUNTER — Inpatient Hospital Stay: Payer: Managed Care, Other (non HMO) | Admitting: Anesthesiology

## 2018-12-04 ENCOUNTER — Encounter
Admission: EM | Disposition: A | Discharge status: Home / Self Care | Payer: Self-pay | Source: Home / Self Care | Attending: Obstetrics & Gynecology

## 2018-12-04 ENCOUNTER — Encounter: Payer: Self-pay | Admitting: Anesthesiology

## 2018-12-04 DIAGNOSIS — Z302 Encounter for sterilization: Secondary | ICD-10-CM

## 2018-12-04 DIAGNOSIS — Z113 Encounter for screening for infections with a predominantly sexual mode of transmission: Secondary | ICD-10-CM

## 2018-12-04 HISTORY — PX: TUBAL LIGATION: SHX77

## 2018-12-04 LAB — CBC
HEMATOCRIT: 33.7 % — AB (ref 36.0–46.0)
Hemoglobin: 10.9 g/dL — ABNORMAL LOW (ref 12.0–15.0)
MCH: 25.8 pg — AB (ref 26.0–34.0)
MCHC: 32.3 g/dL (ref 30.0–36.0)
MCV: 79.7 fL — ABNORMAL LOW (ref 80.0–100.0)
Platelets: 202 10*3/uL (ref 150–400)
RBC: 4.23 MIL/uL (ref 3.87–5.11)
RDW: 14.3 % (ref 11.5–15.5)
WBC: 10.5 10*3/uL (ref 4.0–10.5)
nRBC: 0 % (ref 0.0–0.2)

## 2018-12-04 SURGERY — LIGATION, FALLOPIAN TUBE, POSTPARTUM
Anesthesia: General | Site: Abdomen | Laterality: Bilateral

## 2018-12-04 MED ORDER — ONDANSETRON HCL 4 MG/2ML IJ SOLN
INTRAMUSCULAR | Status: DC | PRN
  Administered 2018-12-04: 4 mg via INTRAVENOUS

## 2018-12-04 MED ORDER — ROCURONIUM BROMIDE 50 MG/5ML IV SOLN
INTRAVENOUS | Status: AC
  Filled 2018-12-04: qty 1

## 2018-12-04 MED ORDER — PROPOFOL 10 MG/ML IV BOLUS
INTRAVENOUS | Status: DC | PRN
  Administered 2018-12-04: 150 mg via INTRAVENOUS

## 2018-12-04 MED ORDER — LACTATED RINGERS IV SOLN
INTRAVENOUS | Status: DC
  Administered 2018-12-04: 20:00:00 via INTRAVENOUS

## 2018-12-04 MED ORDER — ONDANSETRON HCL 4 MG/2ML IJ SOLN
INTRAMUSCULAR | Status: AC
  Filled 2018-12-04: qty 2

## 2018-12-04 MED ORDER — SUGAMMADEX SODIUM 200 MG/2ML IV SOLN
INTRAVENOUS | Status: AC
  Filled 2018-12-04: qty 2

## 2018-12-04 MED ORDER — DEXAMETHASONE SODIUM PHOSPHATE 10 MG/ML IJ SOLN
INTRAMUSCULAR | Status: AC
  Filled 2018-12-04: qty 1

## 2018-12-04 MED ORDER — FENTANYL CITRATE (PF) 100 MCG/2ML IJ SOLN
INTRAMUSCULAR | Status: AC
  Filled 2018-12-04: qty 2

## 2018-12-04 MED ORDER — OXYCODONE HCL 5 MG PO TABS
5.0000 mg | ORAL_TABLET | ORAL | Status: DC | PRN
  Administered 2018-12-04 – 2018-12-05 (×3): 5 mg via ORAL
  Filled 2018-12-04 (×3): qty 1

## 2018-12-04 MED ORDER — FENTANYL CITRATE (PF) 100 MCG/2ML IJ SOLN
25.0000 ug | INTRAMUSCULAR | Status: DC | PRN
  Administered 2018-12-04 (×4): 25 ug via INTRAVENOUS

## 2018-12-04 MED ORDER — ONDANSETRON HCL 4 MG/2ML IJ SOLN: 4.0000 mg | Freq: Once | INTRAMUSCULAR | Status: DC | PRN

## 2018-12-04 MED ORDER — PROPOFOL 10 MG/ML IV BOLUS
INTRAVENOUS | Status: AC
  Filled 2018-12-04: qty 20

## 2018-12-04 MED ORDER — MENTHOL 3 MG MT LOZG
1.0000 | LOZENGE | OROMUCOSAL | Status: DC | PRN
  Filled 2018-12-04: qty 9

## 2018-12-04 MED ORDER — MIDAZOLAM HCL 2 MG/2ML IJ SOLN
INTRAMUSCULAR | Status: AC
  Filled 2018-12-04: qty 2

## 2018-12-04 MED ORDER — LACTATED RINGERS IV SOLN
INTRAVENOUS | Status: DC
  Administered 2018-12-04: 17:00:00 via INTRAVENOUS

## 2018-12-04 MED ORDER — DEXAMETHASONE SODIUM PHOSPHATE 10 MG/ML IJ SOLN
INTRAMUSCULAR | Status: DC | PRN
  Administered 2018-12-04: 10 mg via INTRAVENOUS

## 2018-12-04 MED ORDER — SEVOFLURANE IN SOLN
RESPIRATORY_TRACT | Status: AC
  Filled 2018-12-04: qty 250

## 2018-12-04 MED ORDER — MIDAZOLAM HCL 2 MG/2ML IJ SOLN
INTRAMUSCULAR | Status: DC | PRN
  Administered 2018-12-04: 2 mg via INTRAVENOUS

## 2018-12-04 MED ORDER — HYDROMORPHONE HCL 1 MG/ML IJ SOLN
0.5000 mg | INTRAMUSCULAR | Status: DC | PRN
  Administered 2018-12-04: 0.5 mg via INTRAVENOUS
  Filled 2018-12-04: qty 1

## 2018-12-04 MED ORDER — LIDOCAINE HCL (PF) 2 % IJ SOLN
INTRAMUSCULAR | Status: AC
  Filled 2018-12-04: qty 10

## 2018-12-04 MED ORDER — LIDOCAINE HCL (CARDIAC) PF 100 MG/5ML IV SOSY
PREFILLED_SYRINGE | INTRAVENOUS | Status: DC | PRN
  Administered 2018-12-04: 100 mg via INTRAVENOUS

## 2018-12-04 MED ORDER — ROCURONIUM BROMIDE 100 MG/10ML IV SOLN
INTRAVENOUS | Status: DC | PRN
  Administered 2018-12-04: 30 mg via INTRAVENOUS

## 2018-12-04 MED ORDER — SUGAMMADEX SODIUM 200 MG/2ML IV SOLN
INTRAVENOUS | Status: DC | PRN
  Administered 2018-12-04: 200 mg via INTRAVENOUS

## 2018-12-04 MED ORDER — BUPIVACAINE HCL (PF) 0.5 % IJ SOLN
INTRAMUSCULAR | Status: DC | PRN
  Administered 2018-12-04: 2 mL

## 2018-12-04 MED ORDER — FENTANYL CITRATE (PF) 100 MCG/2ML IJ SOLN
INTRAMUSCULAR | Status: DC | PRN
  Administered 2018-12-04 (×4): 50 ug via INTRAVENOUS

## 2018-12-04 MED ORDER — FENTANYL CITRATE (PF) 100 MCG/2ML IJ SOLN
INTRAMUSCULAR | Status: AC
  Administered 2018-12-04: 25 ug via INTRAVENOUS
  Filled 2018-12-04: qty 2

## 2018-12-04 MED ORDER — BUPIVACAINE HCL (PF) 0.5 % IJ SOLN
INTRAMUSCULAR | Status: AC
  Filled 2018-12-04: qty 30

## 2018-12-04 SURGICAL SUPPLY — 30 items
BLADE SURG SZ11 CARB STEEL (BLADE) ×3 IMPLANT
CHLORAPREP W/TINT 26ML (MISCELLANEOUS) ×3 IMPLANT
COVER WAND RF STERILE (DRAPES) ×3 IMPLANT
DERMABOND ADVANCED (GAUZE/BANDAGES/DRESSINGS) ×2
DERMABOND ADVANCED .7 DNX12 (GAUZE/BANDAGES/DRESSINGS) ×1 IMPLANT
DRAPE LAPAROTOMY 77X122 PED (DRAPES) ×3 IMPLANT
ELECT CAUTERY BLADE 6.4 (BLADE) ×3 IMPLANT
ELECT REM PT RETURN 9FT ADLT (ELECTROSURGICAL) ×3
ELECTRODE REM PT RTRN 9FT ADLT (ELECTROSURGICAL) ×1 IMPLANT
GLOVE BIO SURGEON STRL SZ7 (GLOVE) ×3 IMPLANT
GLOVE BIO SURGEON STRL SZ7.5 (GLOVE) ×3 IMPLANT
GLOVE BIOGEL PI IND STRL 7.5 (GLOVE) ×1 IMPLANT
GLOVE BIOGEL PI INDICATOR 7.5 (GLOVE) ×2
GOWN STRL REUS W/ TWL LRG LVL3 (GOWN DISPOSABLE) ×2 IMPLANT
GOWN STRL REUS W/ TWL XL LVL3 (GOWN DISPOSABLE) ×1 IMPLANT
GOWN STRL REUS W/TWL LRG LVL3 (GOWN DISPOSABLE) ×4
GOWN STRL REUS W/TWL XL LVL3 (GOWN DISPOSABLE) ×2
KIT TURNOVER CYSTO (KITS) ×3 IMPLANT
LABEL OR SOLS (LABEL) ×3 IMPLANT
NEEDLE HYPO 22GX1.5 SAFETY (NEEDLE) ×3 IMPLANT
NS IRRIG 500ML POUR BTL (IV SOLUTION) ×3 IMPLANT
PACK BASIN MINOR ARMC (MISCELLANEOUS) ×3 IMPLANT
SUT MNCRL 4-0 (SUTURE) ×2
SUT MNCRL 4-0 27XMFL (SUTURE) ×1
SUT PLAIN GUT 0 (SUTURE) ×6 IMPLANT
SUT VIC AB 2-0 UR6 27 (SUTURE) ×3 IMPLANT
SUT VIC AB 3-0 SH 27 (SUTURE) ×2
SUT VIC AB 3-0 SH 27X BRD (SUTURE) ×1 IMPLANT
SUTURE MNCRL 4-0 27XMF (SUTURE) ×1 IMPLANT
SYR 10ML LL (SYRINGE) ×3 IMPLANT

## 2018-12-04 NOTE — Anesthesia Postprocedure Evaluation (Signed)
Anesthesia Post Note  Patient: Michelle Bates  Procedure(s) Performed: AN AD HOC LABOR EPIDURAL  Patient location during evaluation: Mother Baby Anesthesia Type: Epidural Level of consciousness: awake and alert Pain management: pain level controlled Vital Signs Assessment: post-procedure vital signs reviewed and stable Respiratory status: spontaneous breathing, nonlabored ventilation and respiratory function stable Cardiovascular status: stable Postop Assessment: no headache, no backache, epidural receding, able to ambulate and patient able to bend at knees Anesthetic complications: no     Last Vitals:  Vitals:   12/04/18 0016 12/04/18 0501  BP: 115/68 121/76  Pulse: 76 82  Resp: 18 16  Temp: 36.6 C 36.6 C  SpO2: 98% 100%    Last Pain:  Vitals:   12/04/18 0501  TempSrc: Oral  PainSc:                  [name]

## 2018-12-04 NOTE — Op Note (Signed)
Operative Note Postpartum Tubal Ligation  Pre-Op Diagnosis: multiparity, desires permanent sterilization  Post-Op Diagnosis: multiparity, desires permanent sterilization  Procedures:  Postpartum tubal ligation via Pomeroy method  Primary Surgeon: Thomasene MohairStephen Jackson, MD  EBL: 30 ml   IVF: 600 mL   Specimens: portion of right and left fallopian tubes  Drains: None  Complications: None   Disposition: PACU   Condition: Stable   Findings: normal appearing bilateral fallopian tubes  Indication: The patient is a 27 y.o. [id number] who is postpartum day 1 status post spontaneous vaginal delivery.  She has been counseled extensively regarding risks, benefits, and alternatives to tubal ligation, including non-permanent forms of contraception that are equivalent in efficacy with potentially better side effects.  She has been advised that there is a failure rate of 3-5 in every 1,000 tubal ligations per year with an increased risk of ectopic pregnancy should pregnancy occur.   Procedure Summary:  The patient was taken to the operating room where general anesthesia was administered and found to be adequate. After timeout was called a small transverse, infraumbilical incision was made with the scalpel. The incision was carried down through the fascia until the peritoneum was identified and entered. The peritoneum was noted to be free of any adhesions and the incision was then extended.  The patient's left fallopian tube was identified, brought incision, and grasped with a Babcock clamp. The tube was then followed out to the fimbria. The Babcock clamp was then used to grasp the tube approximately 4 cm from the cornual region. A 3 cm segment of tube was then ligated with the 2 free ties of plain gut, and excised. Good hemostasis was noted and the tube was returned to the abdomen. The right fallopian tube was then ligated, and a 3 cm segment excised in a similar fashion. Excellent hemostasis was noted,  and the tube returned to the abdomen.  The peritoneum and fascia were closed in a single layer using 0 Vicryl. The skin was closed in a subcuticular fashion using 4-0 monocryl. The closure was also closed with Dermabond.  The patient tolerated the procedure well. Sponge, lap, and needle counts were correct 2. The patient was taken to the recovery room in stable condition.   Thomasene MohairStephen Jackson, MD 12/04/2018 5:54 PM

## 2018-12-04 NOTE — Transfer of Care (Signed)
Immediate Anesthesia Transfer of Care Note  Patient: Lindell SparAnitra D Fraticelli  Procedure(s) Performed: Procedure(s): POST PARTUM TUBAL LIGATION (Bilateral)  Patient Location: PACU  Anesthesia Type:General  Level of Consciousness: sedated  Airway & Oxygen Therapy: Patient Spontanous Breathing and Patient connected to face mask oxygen  Post-op Assessment: Report given to RN and Post -op Vital signs reviewed and stable  Post vital signs: Reviewed and stable  Last Vitals:  Vitals:   12/04/18 1125 12/04/18 1804  BP: 112/74 129/79  Pulse: 78 77  Resp: 18 14  Temp: 36.8 C (!) 36.4 C  SpO2: 100% 100%    Complications: No apparent anesthesia complications

## 2018-12-04 NOTE — Anesthesia Procedure Notes (Signed)
Procedure Name: Intubation Performed by: Doreen Salvage, CRNA Pre-anesthesia Checklist: Patient identified, Patient being monitored, Timeout performed, Emergency Drugs available and Suction available Patient Re-evaluated:Patient Re-evaluated prior to induction Oxygen Delivery Method: Circle system utilized Preoxygenation: Pre-oxygenation with 100% oxygen Induction Type: IV induction and Cricoid Pressure applied Ventilation: Mask ventilation without difficulty Laryngoscope Size: Mac and 3 Grade View: Grade II Tube type: Oral Tube size: 7.0 mm Number of attempts: 1 Airway Equipment and Method: Stylet Placement Confirmation: ETT inserted through vocal cords under direct vision,  positive ETCO2 and breath sounds checked- equal and bilateral Secured at: 22 cm Tube secured with: Tape Dental Injury: Teeth and Oropharynx as per pre-operative assessment

## 2018-12-04 NOTE — Evaluation (Signed)
Physical Therapy Evaluation Patient Details Name: Michelle Bates MRN: [id number] DOB: 05/07/1991 Today's Date: 12/04/2018   History of Present Illness  Patient is a [age] year old female s/p vaginal delivery on 12/03/2018 now experiencing pelvic pain and difficulty in walking due to pain in the L hip adductor area. No significant PMH.  Clinical Impression  Pt is a 27 year old female one day post partum who lives in a second floor apartment with her significant other.  She is independent with mobility and works full time at baseline.  Pt is able to perform bed mobility slowly with min A and sit at EOB with guarded posture for pain management.  She reported pelvic pain as well as pain in L inner thigh, which has been restricting pt's ability to advance L LE during swing phase of gait.  Pt more stable with gait with use of RW and SI compression belt following education and fitting of belt.  She was able to demonstrate ability to ascend/descend a 7" step with assistance from significant other, though this will take increased time and pt reported increased pain in L thigh.  Pt was receptive to all education regarding use of SI compression belt, core stability exercises, use of RW and general mobility around home, including stair negotiation while caring for newborn.  Pt would benefit from reinforcement of stair negotiation technique.  Pt will continue to benefit from skilled PT with focus on strength, safe functional mobility and pain management.  Outpatient pelvic health PT is appropriate for plan following discharge.    Follow Up Recommendations Supervision - Intermittent;Outpatient PT    Equipment Recommendations  Rolling walker with 5" wheels    Recommendations for Other Services       Precautions / Restrictions        Mobility  Bed Mobility Overal bed mobility: Needs Assistance Bed Mobility: Supine to Sit;Sit to Supine     Supine to sit: Min assist Sit to supine: Min assist   General  bed mobility comments: Requires assistance to bring LE's over EOB.  Pt's significant other educated in technique for best body mechanics for pain management.  Transfers Overall transfer level: Needs assistance Equipment used: Rolling walker (2 wheeled) Transfers: Sit to/from Stand Sit to Stand: Supervision         General transfer comment: Pt able to slowly rise on her own and PT provided instructions for use of RW.  Ambulation/Gait Ambulation/Gait assistance: Supervision Gait Distance (Feet): 50 Feet Assistive device: Rolling walker (2 wheeled)     Gait velocity interpretation: <1.8 ft/sec, indicate of risk for recurrent falls General Gait Details: Slow antalgic gait with low foot clearance and difficulty in advancing L LE.  Pt demonstrated significant improvement in gait with SI belt donned thought she still experienced pain in L inner thigh.  Stairs Stairs: Yes Stairs assistance: Min assist Stair Management: One rail Left Number of Stairs: 3 General stair comments: Pt's significant other standing below pt supporting her beneath the axilla.  PT instructed pt in lateral stepping up steps and use of R knee flexion to compensate for decreased hip flexion.  Pt able to lead with R LE to ascend and L LE to descend.  Wheelchair Mobility    Modified Rankin (Stroke Patients Only)       Balance Overall balance assessment: Needs assistance Sitting-balance support: Feet supported Sitting balance-Leahy Scale: Good Sitting balance - Comments: Pt posturing to avoid pain but otherwise good balance.   Standing balance support: Bilateral upper  extremity supported Standing balance-Leahy Scale: Fair                               Pertinent Vitals/Pain Pain Assessment: Faces Faces Pain Scale: Hurts whole lot Pain Location: Areas surrounding SI joints, pubic symphasis and L inner thigh. Pain Descriptors / Indicators: Aching Pain Intervention(s): Limited activity within  patient's tolerance;Monitored during session    Home Living Family/patient expects to be discharged to:: Private residence Living Arrangements: Spouse/significant other Available Help at Discharge: Available 24 hours/day Type of Home: Apartment Home Access: Stairs to enter Entrance Stairs-Rails: Can reach both Entrance Stairs-Number of Steps: 17 Home Layout: One level Home Equipment: None      Prior Function Level of Independence: Independent         Comments: Patient works full time and has one older child to care for in addition to new baby.     Hand Dominance        Extremity/Trunk Assessment   Upper Extremity Assessment Upper Extremity Assessment: Overall WFL for tasks assessed    Lower Extremity Assessment Lower Extremity Assessment: Overall WFL for tasks assessed(L hip flexion limited by pain with functional movement.)    Cervical / Trunk Assessment Cervical / Trunk Assessment: Normal  Communication   Communication: No difficulties  Cognition Arousal/Alertness: Awake/alert Behavior During Therapy: WFL for tasks assessed/performed Overall Cognitive Status: Within Functional Limits for tasks assessed                                        General Comments      Exercises Other Exercises Other Exercises: Education regarding use of SI compression belt as well as fitting pt for belt.  Pt experienced pain relief immediately with placement of belt.  x10 min Other Exercises: Instruction in various pelvic stabilization exercises including supine, seated, squatting and step ups with pelvic bracing.  x15 min Other Exercises: Discussed use of moby wrap to carry baby for hands free with use of RW.  Pt much more stable with RW for ambulation at this time. x3 min   Assessment/Plan    PT Assessment Patient needs continued PT services  PT Problem List Decreased strength;Decreased mobility;Decreased activity tolerance;Decreased knowledge of use of  DME;Decreased balance;Pain       PT Treatment Interventions DME instruction;Functional mobility training;Balance training;Patient/family education;Gait training;Therapeutic activities;Stair training;Therapeutic exercise    PT Goals (Current goals can be found in the Care Plan section)  Acute Rehab PT Goals Patient Stated Goal: To return to caring for her children and to work when maternity leave is over. PT Goal Formulation: With patient Time For Goal Achievement: 12/18/18 Potential to Achieve Goals: Good    Frequency Min 2X/week   Barriers to discharge        Co-evaluation               AM-PAC PT "6 Clicks" Mobility  Outcome Measure Help needed turning from your back to your side while in a flat bed without using bedrails?: A Little Help needed moving from lying on your back to sitting on the side of a flat bed without using bedrails?: A Lot Help needed moving to and from a bed to a chair (including a wheelchair)?: A Little Help needed standing up from a chair using your arms (e.g., wheelchair or bedside chair)?: A Little Help needed to walk  in hospital room?: A Little Help needed climbing 3-5 steps with a railing? : A Lot 6 Click Score: 16    End of Session Equipment Utilized During Treatment: Gait belt Activity Tolerance: Patient tolerated treatment well;Patient limited by pain Patient left: in bed;with family/visitor present Nurse Communication: Mobility status PT Visit Diagnosis: Unsteadiness on feet (R26.81);Other abnormalities of gait and mobility (R26.89);Muscle weakness (generalized) (M62.81)    Time: 1126-1229 PT Time Calculation (min) (ACUTE ONLY): 63 min   Charges:   PT Evaluation $PT Eval Moderate Complexity: 1 Mod PT Treatments $Gait Training: 8-22 mins $Therapeutic Activity: 8-22 mins        Glenetta Hew, PT, DPT   Glenetta Hew 12/04/2018, 4:26 PM

## 2018-12-04 NOTE — Progress Notes (Signed)
PT notified of consult.  Will be up shortly

## 2018-12-04 NOTE — Progress Notes (Signed)
PPD#1 SVD Subjective:  Having difficulty with ambulation because of pelvic floor pain. She states that she has had this pain in late pregnancy as well. She is concerned because she has stairs to navigate at home. Pain control is adequate with PRN medication. Voiding without difficulty. Tolerating a regular diet.  Objective:   Blood pressure 117/73, pulse 67, temperature 97.9 F (36.6 C), temperature source Oral, resp. rate 18, height 5\' 2"  (1.575 m), weight 101.2 kg, last menstrual period 02/26/2018, SpO2 100 %, currently breastfeeding.  General: Discomfort with ambulation Pulmonary: no increased work of breathing Abdomen: non-distended, non-tender Uterus:  fundus firm; lochia appropriate Laceration: N/A Extremities: no signs of DVT  Results for orders placed or performed during the hospital encounter of 12/03/18 (from the past 72 hour(s))  CBC     Status: Abnormal   Collection Time: 12/03/18  9:28 AM  Result Value Ref Range   WBC 8.2 4.0 - 10.5 K/uL   RBC 4.86 3.87 - 5.11 MIL/uL   Hemoglobin 12.5 12.0 - 15.0 g/dL   HCT 16.138.3 09.636.0 - 04.546.0 %   MCV 78.8 (L) 80.0 - 100.0 fL   MCH 25.7 (L) 26.0 - 34.0 pg   MCHC 32.6 30.0 - 36.0 g/dL   RDW 40.914.2 81.111.5 - 91.415.5 %   Platelets 261 150 - 400 K/uL   nRBC 0.2 0.0 - 0.2 %    Comment: Performed at [facility], [address] [place], [place] [address]  Type and screen     Status: None   Collection Time: 12/03/18 10:01 AM  Result Value Ref Range   ABO/RH(D) O POS    Antibody Screen NEG    Sample Expiration      12/06/2018 Performed at [facility], [address] [place], [place] [address]   CBC     Status: Abnormal   Collection Time: 12/04/18  5:26 AM  Result Value Ref Range   WBC 10.5 4.0 - 10.5 K/uL   RBC 4.23 3.87 - 5.11 MIL/uL   Hemoglobin 10.9 (L) 12.0 - 15.0 g/dL   HCT 86.533.7 (L) 78.436.0 - 69.646.0 %   MCV 79.7 (L) 80.0 - 100.0 fL   MCH 25.8 (L) 26.0 - 34.0 pg   MCHC 32.3 30.0 - 36.0 g/dL   RDW 29.514.3 28.411.5 - 13.215.5 %   Platelets 202 150 - 400 K/uL   nRBC 0.0 0.0 - 0.2 %    Comment: Performed at [facility], [address] [place], [place] [address]    Assessment:   27 y.o. [id number] postpartum day # 1 with pelvic floor pain  Plan:   1) Acute blood loss anemia - hemodynamically stable and asymptomatic  2) Blood Type --/--/O POS (12/25 1001) /   3) Rubella 3.14 (06/10 1121) / Varicella Immune /  TDAP status: received 10/07/18  4) Breast feeding  5) Contraception: tubal ligation scheduled today  6) Ordered physical therapy consult for impaired mobility due to pelvic floor pain, may benefit from assistive device.  7) Disposition: continue postpartum care.  Marcelyn BruinsJacelyn Schmid, CNM 12/04/2018  9:02 AM

## 2018-12-04 NOTE — Anesthesia Preprocedure Evaluation (Signed)
Anesthesia Evaluation  Patient identified by MRN, date of birth, ID band Patient awake    Reviewed: Allergy & Precautions, H&P , NPO status , Patient's Chart, lab work & pertinent test results, reviewed documented beta blocker date and time   Airway Mallampati: II  TM Distance: >3 FB Neck ROM: full    Dental no notable dental hx. (+) Teeth Intact   Pulmonary neg pulmonary ROS, Current Smoker,    Pulmonary exam normal breath sounds clear to auscultation       Cardiovascular Exercise Tolerance: Good negative cardio ROS   Rhythm:regular Rate:Normal     Neuro/Psych negative neurological ROS  negative psych ROS   GI/Hepatic negative GI ROS, Neg liver ROS,   Endo/Other  negative endocrine ROSdiabetes, Well Controlled, Gestational  Renal/GU      Musculoskeletal   Abdominal   Peds  Hematology negative hematology ROS (+)   Anesthesia Other Findings   Reproductive/Obstetrics negative OB ROS                             Anesthesia Physical  Anesthesia Plan  ASA: II  Anesthesia Plan: General   Post-op Pain Management:    Induction: Intravenous  PONV Risk Score and Plan:   Airway Management Planned: Oral ETT  Additional Equipment:   Intra-op Plan:   Post-operative Plan: Extubation in OR  Informed Consent: I have reviewed the patients History and Physical, chart, labs and discussed the procedure including the risks, benefits and alternatives for the proposed anesthesia with the patient or authorized representative who has indicated his/her understanding and acceptance.   Dental advisory given  Plan Discussed with: CRNA and Surgeon  Anesthesia Plan Comments:         Anesthesia Quick Evaluation

## 2018-12-04 NOTE — Progress Notes (Signed)
27 y.o. [id number]  with undesired fertility, desires permanent sterilization.  Other reversible forms of contraception were discussed with patient; she declines all other modalities. Permanent nature of as well as associated risks of the procedure discussed with patient including but not limited to: risk of regret, permanence of method, bleeding, infection, injury to surrounding organs and need for additional procedures.  Failure risk of 0.5-1% with increased risk of ectopic gestation if pregnancy occurs was also discussed with patient.  The patient wishes to proceed.   Thomasene MohairStephen Jackson, MD, Merlinda FrederickFACOG [facility], [facility] 12/04/2018 10:54 AM

## 2018-12-04 NOTE — Care Management (Signed)
[place] [facility] [facility] [place] [facility] per [facility] recommendation. MD will need to place outpatient PT order in Epic for patient to go to [facility] rehab located on lower level of hospital.

## 2018-12-04 NOTE — Anesthesia Post-op Follow-up Note (Signed)
Anesthesia QCDR form completed.        

## 2018-12-05 ENCOUNTER — Encounter: Payer: Self-pay | Admitting: Obstetrics and Gynecology

## 2018-12-05 ENCOUNTER — Encounter: Payer: Managed Care, Other (non HMO) | Admitting: Maternal Newborn

## 2018-12-05 LAB — RPR: RPR Ser Ql: NONREACTIVE

## 2018-12-05 MED ORDER — OXYCODONE HCL 5 MG PO TABS: 5.0000 mg | ORAL_TABLET | Freq: Four times a day (QID) | ORAL | 0 refills | Status: AC | PRN

## 2018-12-05 MED ORDER — IBUPROFEN 600 MG PO TABS: 600.0000 mg | ORAL_TABLET | Freq: Four times a day (QID) | ORAL | 1 refills | Status: DC | PRN

## 2018-12-05 NOTE — Progress Notes (Signed)
PT Cancellation Note  Patient Details Name: Michelle Bates MRN: [id number] DOB: June 27, 1991   Cancelled Treatment:    Reason Eval/Treat Not Completed: Patient declined, no reason specified.  Patient just starting to eat lunch and reports soreness due to having walked around to straighten room.  HEP issued and pt ensured that she didn't have any more questions.  Please contact PT if a need arises.  Glenetta HewSarah Gamal Todisco, PT, DPT  Glenetta HewSarah Vitali Seibert 12/05/2018, 11:30 AM

## 2018-12-05 NOTE — Discharge Instructions (Signed)
Postpartum Tubal Ligation, Care After  Refer to this sheet in the next few weeks. These instructions provide you with information about caring for yourself after your procedure. Your health care provider may also give you more specific instructions. Your treatment has been planned according to current medical practices, but problems sometimes occur. Call your health care provider if you have any problems or questions after your procedure.  What can I expect after the procedure?  After the procedure, it is common to have:   A sore throat.   Bruising or pain in your back.   Nausea or vomiting.   Dizziness.   Mild abdominal discomfort or pain, such as cramping, gas pain, or feeling bloated.   Soreness where the incision was made.   Tiredness.   Pain in your shoulders.  Follow these instructions at home:  Medicines   Take over-the-counter and prescription medicines only as told by your health care provider.   Do not take aspirin because it can cause bleeding.   Do not drive or operate heavy machinery while taking prescription pain medicine.  Activity   Rest for the rest of the day.   Gradually return to your normal activities over the next few days.   Do not have sex, douche, or put a tampon or anything else in your vagina for 6 weeks or as long as told by your health care provider.   Do not lift anything that is heavier than your baby for 2 weeks or as long as told by your health care provider.  Incision care          Follow instructions from your health care provider about how to take care of your incision. Make sure you:  ? Wash your hands with soap and water before you change your bandage (dressing). If soap and water are not available, use hand sanitizer.  ? Change your dressing as told by your health care provider.  ? Leave stitches (sutures) in place. They may need to stay in place for 2 weeks or longer.   Check your incision area every day for signs of infection. Check for:  ? More redness,  swelling, or pain.  ? More fluid or blood.  ? Warmth.  ? Pus or a bad smell.  Other Instructions   Do not take baths, swim, or use a hot tub until your health care provider approves. You may take showers.   Keep all follow-up visits as told by your health care provider. This is important.  Contact a health care provider if:   You have more redness, swelling, or pain around your incision.   Your incision feels warm to the touch.   You have pus or a bad smell coming from your incision.   The edges of your incision break open after the sutures have been removed.   Your pain does not improve after 2-3 days.   You have a rash.   You repeatedly become dizzy or lightheaded.   Your pain medicine is not helping.   You are constipated.  Get help right away if:   You have a fever.   You faint.   You have pain in your abdomen that gets worse.   You have fluid or blood coming from your sutures.   You have shortness of breath or difficulty breathing.   You have chest pain or leg pain.   You have ongoing nausea or diarrhea.  This information is not intended to replace advice given to you 

## 2018-12-05 NOTE — Anesthesia Postprocedure Evaluation (Signed)
Anesthesia Post Note  Patient: Michelle Bates  Procedure(s) Performed: POST PARTUM TUBAL LIGATION (Bilateral Abdomen)  Patient location during evaluation: PACU Anesthesia Type: General Level of consciousness: awake and alert and oriented Pain management: pain level controlled Vital Signs Assessment: post-procedure vital signs reviewed and stable Respiratory status: spontaneous breathing Cardiovascular status: blood pressure returned to baseline Anesthetic complications: no     Last Vitals:  Vitals:   12/05/18 0022 12/05/18 0428  BP: 120/74 104/65  Pulse: 71 67  Resp: 16 18  Temp: 36.7 C 36.6 C  SpO2: 100% 97%    Last Pain:  Vitals:   12/05/18 0428  TempSrc: Oral  PainSc:                  Bhavya Grand

## 2018-12-08 LAB — SURGICAL PATHOLOGY

## 2019-01-08 ENCOUNTER — Telehealth: Payer: Self-pay | Admitting: Advanced Practice Midwife

## 2019-01-08 NOTE — Telephone Encounter (Signed)
Patient is calling for help. Patient report delivered in [place] and is having problems with her milk amount.Please advise

## 2019-01-08 NOTE — Telephone Encounter (Signed)
Spoke with patient regarding milk supply. She states everything was going fine and then she stopped making milk. She has been giving the baby formula. She is still pumping but not producing much. Call has been made for referral to [facility]. She said they tried to call her but she missed the call. She left a message for LC. Discussed assessment with LC is recommended.

## 2019-01-09 NOTE — Telephone Encounter (Signed)
Pt left message on 1/30 for LC to call after LC had left office for the day, I returned the call today but there was no answer and I left message for pt to call back and leave a message as to what her need is and best time to reach her

## 2019-01-26 ENCOUNTER — Encounter: Payer: Self-pay | Admitting: Advanced Practice Midwife

## 2019-01-26 ENCOUNTER — Ambulatory Visit (INDEPENDENT_AMBULATORY_CARE_PROVIDER_SITE_OTHER): Payer: Managed Care, Other (non HMO) | Admitting: Advanced Practice Midwife

## 2019-01-26 DIAGNOSIS — Z1389 Encounter for screening for other disorder: Secondary | ICD-10-CM

## 2019-01-26 NOTE — Patient Instructions (Signed)
Breastfeeding and Low Milk Supply  It is normal to have some problems when you start to breastfeed your new baby. One problem is having a low amount of breast milk. If you have a low milk supply, this may cause your baby to not gain enough weight. Making sure your breasts are emptied during feedings can help prevent a low milk supply.  Follow these instructions at home:  When to breastfeed your baby  · Breastfeed when you feel like you need to reduce the fullness of your breasts or when your baby shows signs of hunger. This is called "breastfeeding on demand."  · Do not delay feedings. Feed your baby often.  General instructions    · Try to empty your breasts of milk at each feeding. This will cause them to make more milk.  · If your breast is not empty after a feeding, use a pump or squeeze with your hand (hand express) to get the rest of the milk out.  · Make sure your baby's mouth attaches to your nipple (latches) properly when breastfeeding.  · Make sure your baby is in the right position when breastfeeding. Try different positions to find one that helps your baby feed better.  · Do not give your baby extra formula unless your doctor or breastfeeding specialist (lactation consultant) tells you to do that.  Medicines  · Let your doctor know what over-the-counter or prescription medicines you are taking. Some medicines may affect how much milk you make.  · Talk to your doctor or breastfeeding specialist before you take any herbal supplements.  Contact a doctor if:  · Your baby does not gain weight.  · Your baby loses weight.  · You continue to have a low milk supply.  Summary  · If you have a low milk supply, this may cause your baby to not gain enough weight.  · Feed your baby often. Do not delay feedings.  · Try to empty your breasts of milk at each feeding. Use a pump or squeeze with your hand (hand express) to get remaining milk out after a feeding.  This information is not intended to replace advice given to  you by your health care provider. Make sure you discuss any questions you have with your health care provider.  Document Released: 07/03/2017 Document Revised: 07/03/2017 Document Reviewed: 07/03/2017  Elsevier Interactive Patient Education © 2019 [employer]

## 2019-01-26 NOTE — Progress Notes (Signed)
Postpartum Visit  Chief Complaint:  Chief Complaint  Patient presents with  . Postpartum Care    Vaginal delivery 12/25    History of Present Illness: Patient is a 28 y.o. [id number] presents for postpartum visit. She has had a low milk supply and is pumping regularly. She is using lactation support products. She [facility] yet but plans to try calling again.   Review the Delivery Report for details.  Delivery Note At 3:37 PM a viable female was delivered via Vaginal, Spontaneous (Presentation: OA;  LOA).  APGAR: 8, 9; weight 6 lb 8.4 oz (2960 g).   Placenta status: spontaneous, intact.  Cord:  with the following complications: none.  Cord pH: NA   Anesthesia:  epidural Episiotomy: None Lacerations: none  Suture Repair: NA Est. Blood Loss (mL): 175  Mom to postpartum.  Baby to Couplet care / Skin to Skin.  Tresea MallJane Darrelle Wiberg, CNM 01/26/2019, 1:59 PM   Date of delivery: 12/03/2018 Type of delivery: Vaginal delivery - Vacuum or forceps assisted  no Episiotomy No.  Laceration: no  Pregnancy or labor problems:  no Any problems since the delivery:  Decreased milk supply  Newborn Details:  SINGLETON :  1. BabyGender female. Birth weight:   Maternal Details:  Breast or formula feeding: breast pumping and formula feeding Intercourse: Yes  Contraception after delivery: Bilateral tubal ligation Any bowel or bladder issues: No  Post partum depression/anxiety noted:  no Edinburgh Post-Partum Depression Score: 1 Date of last PAP: 1 year ago  no abnormalities   Review of Systems: Review of Systems  Constitutional: Negative.   HENT: Negative.   Eyes: Negative.   Respiratory: Negative.   Cardiovascular: Negative.   Gastrointestinal: Negative.   Genitourinary: Negative.   Musculoskeletal: Negative.   Skin: Negative.   Neurological: Negative.   Endo/Heme/Allergies: Negative.   Psychiatric/Behavioral: Negative.     Past Medical History:  Past Medical  History:  Diagnosis Date  . Family history of breast cancer    cancer genetic testing letter sent 6/19  . Medical history non-contributory   . Obesity   . Tonsillitis     Past Surgical History:  Past Surgical History:  Procedure Laterality Date  . NO PAST SURGERIES    . TUBAL LIGATION Bilateral 12/04/2018   Procedure: POST PARTUM TUBAL LIGATION;  Surgeon: Conard NovakJackson, Stephen D, MD;  Location: [facility] ORS;  Service: Gynecology;  Laterality: Bilateral;    Family History:  Family History  Problem Relation Age of Onset  . Colon cancer Mother [id number]  . Brain cancer Father 7640  . Prostate cancer Father 8459  . Breast cancer Maternal Aunt 4140    Social History:  Social History   Socioeconomic History  . Marital status: Single    Spouse name: Not on file  . Number of children: Not on file  . Years of education: Not on file  . Highest education level: Not on file  Occupational History  . Not on file  Social Needs  . Financial resource strain: Not hard at all  . Food insecurity:    Worry: Never true    Inability: Never true  . Transportation needs:    Medical: No    Non-medical: No  Tobacco Use  . Smoking status: Never Smoker  . Smokeless tobacco: Never Used  Substance and Sexual Activity  . Alcohol use: No    Frequency: Never  . Drug use: No  . Sexual activity: Yes  Birth control/protection: Surgical    Comment: BTL   Lifestyle  . Physical activity:    Days per week: 3 days    Minutes per session: 30 min  . Stress: Not at all  Relationships  . Social connections:    Talks on phone: Three times a week    Gets together: Three times a week    Attends religious service: 1 to 4 times per year    Active member of club or organization: No    Attends meetings of clubs or organizations: Never    Relationship status: Married  . Intimate partner violence:    Fear of current or ex partner: No    Emotionally abused: No    Physically abused: No    Forced sexual activity: No    Other Topics Concern  . Not on file  Social History Narrative  . Not on file    Allergies:  No Known Allergies  Medications: Prior to Admission medications   Not on File    Physical Exam Blood pressure 130/80, pulse 87, weight 221 lb (100.2 kg), last menstrual period 02/26/2018, currently breastfeeding.    General: NAD HEENT: normocephalic, anicteric Pulmonary: No increased work of breathing Abdomen: NABS, soft, non-tender, non-distended.  Umbilicus without lesions.  No hepatomegaly, splenomegaly or masses palpable. No evidence of hernia. Genitourinary:  External: Normal external female genitalia.  Normal urethral meatus, normal  Bartholin's and Skene's glands.    Vagina: Normal vaginal mucosa, no evidence of prolapse.    Cervix: no CMT  Uterus: Non-enlarged, mobile, normal contour.   Adnexa: ovaries non-enlarged, no adnexal masses  Rectal: deferred Extremities: no edema, erythema, or tenderness Neurologic: Grossly intact Psychiatric: mood appropriate, affect full  Edinburgh Postnatal Depression Scale - 01/26/19 1344      Edinburgh Postnatal Depression Scale:  In the Past 7 Days   I have been able to laugh and see the funny side of things.  0    I have looked forward with enjoyment to things.  0    I have blamed myself unnecessarily when things went wrong.  1    I have been anxious or worried for no good reason.  0    I have felt scared or panicky for no good reason.  0    Things have been getting on top of me.  0    I have been so unhappy that I have had difficulty sleeping.  0    I have felt sad or miserable.  0    I have been so unhappy that I have been crying.  0    The thought of harming myself has occurred to me.  0    Edinburgh Postnatal Depression Scale Total  1       Assessment: 28 y.o. [id number] presenting for 6 week postpartum visit  Plan: Problem List Items Addressed This Visit    None    Visit Diagnoses    6 weeks postpartum follow-up    -   Primary       1) Contraception - Education given regarding options for contraception, as well as compatibility with breast feeding if applicable.  Patient plans on tubal ligation for contraception.  2)  Pap - ASCCP guidelines and rational discussed.  ASCCP guidelines and rational discussed.  Patient opts for every 5 years screening interval  3) Patient underwent screening for postpartum depression with no signs of depression   4) Call lactation consultant for advice on low milk supply  5)  Return in about 1 year (around 01/27/2020) for annual established gyn.   Tresea Mall, CNM [facility], [facility] 01/26/2019, 1:59 PM

## 2019-01-30 ENCOUNTER — Ambulatory Visit: Payer: Managed Care, Other (non HMO) | Admitting: Advanced Practice Midwife

## 2020-08-03 ENCOUNTER — Ambulatory Visit: Payer: Managed Care, Other (non HMO) | Admitting: Dietician

## 2020-08-12 ENCOUNTER — Ambulatory Visit: Payer: Managed Care, Other (non HMO) | Admitting: Dietician

## 2021-10-04 ENCOUNTER — Ambulatory Visit (INDEPENDENT_AMBULATORY_CARE_PROVIDER_SITE_OTHER): Payer: Managed Care, Other (non HMO) | Admitting: Advanced Practice Midwife

## 2021-10-04 ENCOUNTER — Other Ambulatory Visit: Payer: Self-pay

## 2021-10-04 ENCOUNTER — Encounter: Payer: Self-pay | Admitting: Advanced Practice Midwife

## 2021-10-04 ENCOUNTER — Other Ambulatory Visit (HOSPITAL_COMMUNITY)
Admission: RE | Admit: 2021-10-04 | Discharge: 2021-10-04 | Disposition: A | Discharge status: Home / Self Care | Payer: Managed Care, Other (non HMO) | Source: Ambulatory Visit | Attending: Advanced Practice Midwife | Admitting: Advanced Practice Midwife

## 2021-10-04 VITALS — BP 100/70 | Ht 64.0 in | Wt 200.0 lb

## 2021-10-04 DIAGNOSIS — Z01419 Encounter for gynecological examination (general) (routine) without abnormal findings: Secondary | ICD-10-CM | POA: Diagnosis present

## 2021-10-04 DIAGNOSIS — Z124 Encounter for screening for malignant neoplasm of cervix: Secondary | ICD-10-CM | POA: Insufficient documentation

## 2021-10-04 DIAGNOSIS — Z113 Encounter for screening for infections with a predominantly sexual mode of transmission: Secondary | ICD-10-CM | POA: Insufficient documentation

## 2021-10-04 NOTE — Progress Notes (Signed)
Gynecology Annual Exam   PCP: Vena Austria, MD  Chief Complaint:  Chief Complaint  Patient presents with   Annual Exam    History of Present Illness: Patient is a 30 y.o. [id number] presents for annual exam. The patient has no complaints today. She would like to have STD testing today prior to starting a new relationship. Her new partner has a history of herpes infection. She is counseled today to protect herself from HSV.   LMP: Patient's last menstrual period was 09/12/2021. Average Interval: regular, 28 days Duration of flow:  3-5  days Heavy Menses: first few days Clots: no Intermenstrual Bleeding: no Postcoital Bleeding: no Dysmenorrhea: no  The patient is not currently sexually active. She currently uses tubal ligation for contraception. She denies dyspareunia.  The patient does perform self breast exams.  There  is possibly  notable family history of breast or ovarian cancer in her family. Her maternal aunt had breast cancer in her 22s or 60s.   The patient wears seatbelts: yes.   The patient has regular exercise:  she works out 3 days per week. She admits increased intake of sweets. She admits adequate hydration. She usually has 4-5 hours of sleep .    The patient denies current symptoms of depression.    Review of Systems: Review of Systems  Constitutional:  Negative for chills and fever.  HENT:  Negative for congestion, ear discharge, ear pain, hearing loss, sinus pain and sore throat.   Eyes:  Negative for blurred vision and double vision.  Respiratory:  Negative for cough, shortness of breath and wheezing.   Cardiovascular:  Negative for chest pain, palpitations and leg swelling.  Gastrointestinal:  Negative for abdominal pain, blood in stool, constipation, diarrhea, heartburn, melena, nausea and vomiting.  Genitourinary:  Negative for dysuria, flank pain, frequency, hematuria and urgency.  Musculoskeletal:  Negative for back pain, joint pain and myalgias.   Skin:  Negative for itching and rash.  Neurological:  Negative for dizziness, tingling, tremors, sensory change, speech change, focal weakness, seizures, loss of consciousness, weakness and headaches.  Endo/Heme/Allergies:  Negative for environmental allergies. Does not bruise/bleed easily.  Psychiatric/Behavioral:  Negative for depression, hallucinations, memory loss, substance abuse and suicidal ideas. The patient is not nervous/anxious and does not have insomnia.    Past Medical History:  There are no problems to display for this patient.   Past Surgical History:  Past Surgical History:  Procedure Laterality Date   NO PAST SURGERIES     TUBAL LIGATION Bilateral 12/04/2018   Procedure: POST PARTUM TUBAL LIGATION;  Surgeon: Conard Novak, MD;  Location: [facility] ORS;  Service: Gynecology;  Laterality: Bilateral;    Gynecologic History:  Patient's last menstrual period was 09/12/2021. Contraception: tubal ligation Last Pap: Results were: no abnormalities   Obstetric History: L2G4010  Family History:  Family History  Problem Relation Age of Onset   Colon cancer Mother 38   Brain cancer Father 76   Prostate cancer Father 21   Breast cancer Maternal Aunt 17    Social History:  Social History   Socioeconomic History   Marital status: Single    Spouse name: Not on file   Number of children: Not on file   Years of education: Not on file   Highest education level: Not on file  Occupational History   Not on file  Tobacco Use   Smoking status: Never   Smokeless tobacco: Never  Vaping Use   Vaping Use: Never  used  Substance and Sexual Activity   Alcohol use: No   Drug use: No   Sexual activity: Yes    Birth control/protection: Surgical    Comment: BTL   Other Topics Concern   Not on file  Social History Narrative   Not on file   Social Determinants of Health   Financial Resource Strain: Not on file  Food Insecurity: Not on file  Transportation Needs: Not on  file  Physical Activity: Not on file  Stress: Not on file  Social Connections: Not on file  Intimate Partner Violence: Not on file    Allergies:  No Known Allergies  Medications: Prior to Admission medications   Not on File    Physical Exam Vitals: Blood pressure 100/70, height 5\' 4"  (1.626 m), weight 200 lb (90.7 kg), last menstrual period 09/12/2021  General: NAD HEENT: normocephalic, anicteric Thyroid: no enlargement, no palpable nodules Pulmonary: No increased work of breathing, CTAB Cardiovascular: RRR, distal pulses 2+ Breast: Breast symmetrical, no tenderness, no palpable nodules or masses, no skin or nipple retraction present, no nipple discharge.  No axillary or supraclavicular lymphadenopathy. Abdomen: NABS, soft, non-tender, non-distended.  Umbilicus without lesions.  No hepatomegaly, splenomegaly or masses palpable. No evidence of hernia  Genitourinary:  External: Normal external female genitalia.  Normal urethral meatus, normal Bartholin's and Skene's glands.    Vagina: Normal vaginal mucosa, no evidence of prolapse.    Cervix: Grossly normal in appearance, no bleeding, no CMT Extremities: no edema, erythema, or tenderness Neurologic: Grossly intact Psychiatric: mood appropriate, affect full   Assessment: 30 y.o. 30 routine annual exam  Plan: Problem List Items Addressed This Visit   None Visit Diagnoses     Well woman exam with routine gynecological exam    -  Primary   Relevant Orders   Cytology - PAP   Screen for sexually transmitted diseases       Relevant Orders   Cytology - PAP   Screening for cervical cancer       Relevant Orders   Cytology - PAP       1) STI screening  was offered and accepted  2)  ASCCP guidelines and rationale discussed.  Patient opts for every 3 years screening interval. PAP today  3) Contraception - the patient is currently using  tubal ligation.  She is  contemplating reversal surgery due to new partner. We  discussed the implications.  4) Routine healthcare maintenance including cholesterol, diabetes screening discussed Declines  5) Increase healthy lifestyle; diet, sleep  6) Return in about 1 year (around 10/04/2022) for annual established gyn.   10/06/2022, CNM [facility] 10/04/2021, 4:52 PM

## 2021-10-10 LAB — CYTOLOGY - PAP
Chlamydia: NEGATIVE
Comment: NEGATIVE
Comment: NEGATIVE
Comment: NEGATIVE
Comment: NORMAL
Diagnosis: NEGATIVE
High risk HPV: NEGATIVE
Neisseria Gonorrhea: NEGATIVE
Trichomonas: NEGATIVE

## 2022-10-08 ENCOUNTER — Encounter: Payer: Self-pay | Admitting: Advanced Practice Midwife

## 2022-10-08 ENCOUNTER — Ambulatory Visit (INDEPENDENT_AMBULATORY_CARE_PROVIDER_SITE_OTHER): Payer: Managed Care, Other (non HMO) | Admitting: Advanced Practice Midwife

## 2022-10-08 ENCOUNTER — Other Ambulatory Visit (HOSPITAL_COMMUNITY)
Admission: RE | Admit: 2022-10-08 | Discharge: 2022-10-08 | Disposition: A | Discharge status: Home / Self Care | Payer: Managed Care, Other (non HMO) | Source: Ambulatory Visit | Attending: Advanced Practice Midwife | Admitting: Advanced Practice Midwife

## 2022-10-08 VITALS — BP 124/76 | HR 85 | Ht 63.0 in | Wt 178.0 lb

## 2022-10-08 DIAGNOSIS — Z124 Encounter for screening for malignant neoplasm of cervix: Secondary | ICD-10-CM | POA: Insufficient documentation

## 2022-10-08 DIAGNOSIS — Z01419 Encounter for gynecological examination (general) (routine) without abnormal findings: Secondary | ICD-10-CM

## 2022-10-08 NOTE — Progress Notes (Signed)
Patient ID: Michelle Bates, female   DOB: 05/31/1991, 31 y.o.   MRN: [id number]  Reason for Consult: Annual Gyn Exam   Subjective:  HPI:  Michelle Bates is a 31 y.o. female being seen for annual Gyn visit. She has a new partner since her last normal PAP smear a year ago and she would like to have PAP today for that reason and for work incentive purpose. She also has a family history of breast cancer that is significant. It appears that an attempt was made in January of 2019 for prior review/certification for BRCA gene testing. It does not appear that she ever had the testing. The number for [facility] was given to her today to call and see if she qualifies for testing and if her insurance will cover.   Past Medical History:  Diagnosis Date   Family history of breast cancer    cancer genetic testing letter sent 6/19   Medical history non-contributory    Obesity    Tonsillitis    Family History  Problem Relation Age of Onset   Colon cancer Mother 34   Brain cancer Father 67   Prostate cancer Father 66   Breast cancer Maternal Aunt 84   Past Surgical History:  Procedure Laterality Date   NO PAST SURGERIES     TUBAL LIGATION Bilateral 12/04/2018   Procedure: POST PARTUM TUBAL LIGATION;  Surgeon: Will Bonnet, MD;  Location: [facility] ORS;  Service: Gynecology;  Laterality: Bilateral;    Short Social History:  Social History   Tobacco Use   Smoking status: Never   Smokeless tobacco: Never  Substance Use Topics   Alcohol use: No    No Known Allergies  No current outpatient medications on file.   No current facility-administered medications for this visit.    Review of Systems  Constitutional:  Negative for chills and fever.  HENT:  Negative for congestion, ear discharge, ear pain, hearing loss, sinus pain and sore throat.   Eyes:  Negative for blurred vision and double vision.  Respiratory:  Negative for cough, shortness of breath and wheezing.   Cardiovascular:   Negative for chest pain, palpitations and leg swelling.  Gastrointestinal:  Negative for abdominal pain, blood in stool, constipation, diarrhea, heartburn, melena, nausea and vomiting.  Genitourinary:  Negative for dysuria, flank pain, frequency, hematuria and urgency.  Musculoskeletal:  Negative for back pain, joint pain and myalgias.  Skin:  Negative for itching and rash.  Neurological:  Negative for dizziness, tingling, tremors, sensory change, speech change, focal weakness, seizures, loss of consciousness, weakness and headaches.  Endo/Heme/Allergies:  Negative for environmental allergies. Does not bruise/bleed easily.  Psychiatric/Behavioral:  Negative for depression, hallucinations, memory loss, substance abuse and suicidal ideas. The patient is not nervous/anxious and does not have insomnia.         Objective:  Objective   Vitals:   10/08/22 1329  BP: 124/76  Pulse: 85  Weight: 178 lb (80.7 kg)  Height: _0  (1.6 m)   Body mass index is 31.53 kg/m. Constitutional: Well nourished, well developed female in no acute distress.  HEENT: normal Skin: Warm and dry.  Cardiovascular: Regular rate and rhythm.   Extremity:  no edema   Respiratory: Clear to auscultation bilateral. Normal respiratory effort Psych: Alert and Oriented x3. No memory deficits. Normal mood and affect.   Pelvic exam: is not limited by body habitus EGBUS: within normal limits Vagina: within normal limits and with normal mucosa  Cervix: cervical  ectropion present, PAP collected   Assessment/Plan:     31 y.o. G3 P2012 female normal gyn visit/cervical cancer screening  PAP smear Follow up as needed   [facility] 10/08/2022, 5:27 PM

## 2022-10-11 LAB — CYTOLOGY - PAP
Comment: NEGATIVE
Diagnosis: NEGATIVE
High risk HPV: NEGATIVE

## 2023-08-26 DIAGNOSIS — Z800 Family history of malignant neoplasm of digestive organs: Secondary | ICD-10-CM | POA: Insufficient documentation

## 2023-08-27 DIAGNOSIS — E559 Vitamin D deficiency, unspecified: Secondary | ICD-10-CM | POA: Insufficient documentation

## 2023-10-10 ENCOUNTER — Ambulatory Visit: Payer: 59 | Admitting: Certified Nurse Midwife

## 2023-10-10 ENCOUNTER — Other Ambulatory Visit (HOSPITAL_COMMUNITY)
Admission: RE | Admit: 2023-10-10 | Discharge: 2023-10-10 | Disposition: A | Discharge status: Home / Self Care | Payer: 59 | Source: Ambulatory Visit | Attending: Certified Nurse Midwife | Admitting: Certified Nurse Midwife

## 2023-10-10 VITALS — BP 118/62 | HR 76 | Ht 64.0 in | Wt 185.1 lb

## 2023-10-10 DIAGNOSIS — Z01419 Encounter for gynecological examination (general) (routine) without abnormal findings: Secondary | ICD-10-CM

## 2023-10-10 DIAGNOSIS — Z113 Encounter for screening for infections with a predominantly sexual mode of transmission: Secondary | ICD-10-CM

## 2023-10-10 DIAGNOSIS — Z124 Encounter for screening for malignant neoplasm of cervix: Secondary | ICD-10-CM

## 2023-10-10 DIAGNOSIS — Z23 Encounter for immunization: Secondary | ICD-10-CM | POA: Diagnosis not present

## 2023-10-10 DIAGNOSIS — Z1151 Encounter for screening for human papillomavirus (HPV): Secondary | ICD-10-CM

## 2023-10-10 DIAGNOSIS — Z800 Family history of malignant neoplasm of digestive organs: Secondary | ICD-10-CM

## 2023-10-10 DIAGNOSIS — Z114 Encounter for screening for human immunodeficiency virus [HIV]: Secondary | ICD-10-CM

## 2023-10-10 NOTE — Progress Notes (Signed)
ANNUAL EXAM Patient name: Michelle Bates MRN [id number]  Date of birth: 09-24-1991 Chief Complaint:   Annual Exam  History of Present Illness:   Michelle Bates is a 32 y.o. [phone] African-American female being seen today for a routine annual exam.  Current complaints: desires to start HPV vaccine series. Desires STI screening today.  Patient's last menstrual period was 09/12/2023 (approximate).   Upstream - 10/10/23 0940       Pregnancy Intention Screening   Does the patient want to become pregnant in the next year? No    Does the patient's partner want to become pregnant in the next year? No    Would the patient like to discuss contraceptive options today? N/A      Contraception Wrap Up   Current Method Female Sterilization    End Method Female Sterilization    Contraception Counseling Provided No    How was the end contraceptive method provided? N/A            The pregnancy intention screening data noted above was reviewed. Potential methods of contraception were discussed. The patient elected to proceed with Female Sterilization.      Component Value Date/Time   DIAGPAP  10/08/2022 1412    - Negative for intraepithelial lesion or malignancy (NILM)   DIAGPAP  10/04/2021 1611    - Negative for intraepithelial lesion or malignancy (NILM)   HPVHIGH Negative 10/08/2022 1412   HPVHIGH Negative 10/04/2021 1611   ADEQPAP  10/08/2022 1412    Satisfactory for evaluation; transformation zone component PRESENT.   ADEQPAP  10/04/2021 1611    Satisfactory for evaluation; transformation zone component PRESENT.       Last pap 2023. Results were: NILM w/ HRHPV negative. H/O abnormal pap: no Last mammogram: n/a. Results were: N/A. Family h/o breast cancer: yes maternal aunt Last colonoscopy: never. Results were: N/A. Family h/o colorectal cancer: yes patient's mother     10/10/2023    9:41 AM  Depression screen PHQ 2/9  Decreased Interest 0  Down, Depressed, Hopeless 0   PHQ - 2 Score 0         No data to display           Review of Systems:   Pertinent items are noted in HPI Denies any headaches, blurred vision, fatigue, shortness of breath, chest pain, abdominal pain, abnormal vaginal discharge/itching/odor/irritation, problems with periods, bowel movements, urination, or intercourse unless otherwise stated above. Pertinent History Reviewed:  Reviewed past medical,surgical, social and family history.  Reviewed problem list, medications and allergies. Physical Assessment:   Vitals:   10/10/23 0844  BP: 118/62  Pulse: 76  Weight: 185 lb 1.6 oz (84 kg)  Height: 5\' 4"  (1.626 m)  Body mass index is 31.77 kg/m.       Physical Exam Vitals reviewed.  Constitutional:      Appearance: Normal appearance.  HENT:     Head: Normocephalic.  Neck:     Thyroid: No thyroid mass or thyromegaly.  Cardiovascular:     Rate and Rhythm: Normal rate and regular rhythm.     Heart sounds: Normal heart sounds.  Pulmonary:     Effort: Pulmonary effort is normal.     Breath sounds: Normal breath sounds.  Chest:  Breasts:    Tanner Score is 5.     Right: Normal.     Left: Normal.  Abdominal:     General: Abdomen is flat.     Palpations: Abdomen is soft.  Tenderness: There is no abdominal tenderness.  Genitourinary:    General: Normal vulva.     Vagina: Normal.     Cervix: Friability present. No cervical motion tenderness.     Uterus: Not fixed and not tender.      Adnexa:        Right: No mass or tenderness.         Left: No mass or tenderness.    Skin:    General: Skin is warm and dry.  Neurological:     General: No focal deficit present.     Mental Status: She is alert and oriented to person, place, and time.  Psychiatric:        Mood and Affect: Mood normal.        Behavior: Behavior normal.      No results found for this or any previous visit (from the past 24 hour(s)).  Assessment & Plan:  1. Well woman exam with routine  gynecological exam - Cervicovaginal ancillary only - Cytology - PAP - Hepatitis B surface antigen - Hepatitis C antibody - HIV Antibody (routine testing w rflx) - RPR  2. Cervical cancer screening - Cytology - PAP  3. Need for viral immunization - HPV 9-valent vaccine,Recombinat  4. Family history of colon cancer - Ambulatory referral to Gastroenterology  5. Screening examination for venereal disease - Cervicovaginal ancillary only - Hepatitis C antibody - RPR  6. Screening for human immunodeficiency virus - HIV Antibody (routine testing w rflx)  7. Encounter for screening for human papillomavirus (HPV) - Cytology - PAP  Mammogram: @ 32yo, or sooner if problems Colonoscopy:  referral to GI given family hx of Colon CA in her mother in her 30s  Orders Placed This Encounter  Procedures   HPV 9-valent vaccine,Recombinat   Hepatitis B surface antigen   Hepatitis C antibody   HIV Antibody (routine testing w rflx)   RPR   Ambulatory referral to Gastroenterology    Meds: No orders of the defined types were placed in this encounter.   Follow-up: Return in 1 year (on 10/09/2024).  Dominica Severin, CNM 10/10/2023 9:41 AM

## 2023-10-10 NOTE — Patient Instructions (Signed)

## 2023-10-11 LAB — HEPATITIS C ANTIBODY: Hep C Virus Ab: NONREACTIVE

## 2023-10-11 LAB — HEPATITIS B SURFACE ANTIGEN: Hepatitis B Surface Ag: NEGATIVE

## 2023-10-11 LAB — CERVICOVAGINAL ANCILLARY ONLY
Chlamydia: NEGATIVE
Comment: NEGATIVE
Comment: NEGATIVE
Comment: NORMAL
Neisseria Gonorrhea: NEGATIVE
Trichomonas: NEGATIVE

## 2023-10-11 LAB — HIV ANTIBODY (ROUTINE TESTING W REFLEX): HIV Screen 4th Generation wRfx: NONREACTIVE

## 2023-10-11 LAB — RPR: RPR Ser Ql: NONREACTIVE

## 2023-10-14 LAB — CYTOLOGY - PAP
Comment: NEGATIVE
High risk HPV: NEGATIVE

## 2023-10-22 ENCOUNTER — Ambulatory Visit (INDEPENDENT_AMBULATORY_CARE_PROVIDER_SITE_OTHER): Payer: 59

## 2023-10-22 ENCOUNTER — Other Ambulatory Visit: Payer: Self-pay | Admitting: Obstetrics and Gynecology

## 2023-10-22 VITALS — Ht 64.0 in | Wt 184.0 lb

## 2023-10-22 DIAGNOSIS — R399 Unspecified symptoms and signs involving the genitourinary system: Secondary | ICD-10-CM

## 2023-10-22 LAB — POCT URINALYSIS DIPSTICK
Bilirubin, UA: NEGATIVE
Blood, UA: 3
Glucose, UA: NEGATIVE
Ketones, UA: NEGATIVE
Nitrite, UA: NEGATIVE
Protein, UA: NEGATIVE
Spec Grav, UA: 1.015 (ref 1.010–1.025)
Urobilinogen, UA: 0.2 U/dL
pH, UA: 6 (ref 5.0–8.0)

## 2023-10-22 MED ORDER — NITROFURANTOIN MONOHYD MACRO 100 MG PO CAPS: 100.0000 mg | ORAL_CAPSULE | Freq: Two times a day (BID) | ORAL | 0 refills | Status: DC

## 2023-10-22 NOTE — Progress Notes (Signed)
    NURSE VISIT NOTE  Subjective:    Patient ID: Michelle Bates, female    DOB: 1991-04-25, 32 y.o.   MRN: [id number]       HPI  Patient is a 32 y.o. G34P2012 female who presents for UTI symptoms. Has been having frequency and burning at the end of urination, some pelvic and back pain since last week. Denies blood in urine or when wipes. Patient does not have a history of recurrent UTI.  Patient does not have a history of pyelonephritis.    Objective:    Ht 5\' 4"  (1.626 m)   Wt 184 lb (83.5 kg)   LMP 10/16/2023 (Exact Date)   Breastfeeding No   BMI 31.58 kg/m    Lab Review  Results for orders placed or performed in visit on 10/22/23  POCT Urinalysis Dipstick  Result Value Ref Range   Color, UA     Clarity, UA     Glucose, UA Negative Negative   Bilirubin, UA neg    Ketones, UA neg    Spec Grav, UA 1.015 1.010 - 1.025   Blood, UA 3    pH, UA 6.0 5.0 - 8.0   Protein, UA Negative Negative   Urobilinogen, UA 0.2 0.2 or 1.0 E.U./dL   Nitrite, UA neg    Leukocytes, UA Small (1+) (A) Negative   Appearance     Odor      Assessment:   1. UTI symptoms      Plan:   Urine Culture Sent. Treatment  Macrobid 100 mg PO BID for 7 days. Maintain adequate hydration.  May use AZO OTC prn.  Follow up if symptoms worsen or fail to improve as anticipated, and as needed.    Donnetta Hail, CMA

## 2023-10-22 NOTE — Patient Instructions (Signed)
Urinary Tract Infection, Adult A urinary tract infection (UTI) is an infection of any part of the urinary tract. The urinary tract includes: The kidneys. The ureters. The bladder. The urethra. These organs make, store, and get rid of pee (urine) in the body. What are the causes? This infection is caused by germs (bacteria) in your genital area. These germs grow and cause swelling (inflammation) of your urinary tract. What increases the risk? The following factors may make you more likely to develop this condition: Using a small, thin tube (catheter) to drain pee. Not being able to control when you pee or poop (incontinence). Being female. If you are female, these things can increase the risk: Using these methods to prevent pregnancy: A medicine that kills sperm (spermicide). A device that blocks sperm (diaphragm). Having low levels of a female hormone (estrogen). Being pregnant. You are more likely to develop this condition if: You have genes that add to your risk. You are sexually active. You take antibiotic medicines. You have trouble peeing because of: A prostate that is bigger than normal, if you are female. A blockage in the part of your body that drains pee from the bladder. A kidney stone. A nerve condition that affects your bladder. Not getting enough to drink. Not peeing often enough. You have other conditions, such as: Diabetes. A weak disease-fighting system (immune system). Sickle cell disease. Gout. Injury of the spine. What are the signs or symptoms? Symptoms of this condition include: Needing to pee right away. Peeing small amounts often. Pain or burning when peeing. Blood in the pee. Pee that smells bad or not like normal. Trouble peeing. Pee that is cloudy. Fluid coming from the vagina, if you are female. Pain in the belly or lower back. Other symptoms include: Vomiting. Not feeling hungry. Feeling mixed up (confused). This may be the first symptom in  older adults. Being tired and grouchy (irritable). A fever. Watery poop (diarrhea). How is this treated? Taking antibiotic medicine. Taking other medicines. Drinking enough water. In some cases, you may need to see a specialist. Follow these instructions at home:  Medicines Take over-the-counter and prescription medicines only as told by your doctor. If you were prescribed an antibiotic medicine, take it as told by your doctor. Do not stop taking it even if you start to feel better. General instructions Make sure you: Pee until your bladder is empty. Do not hold pee for a long time. Empty your bladder after sex. Wipe from front to back after peeing or pooping if you are a female. Use each tissue one time when you wipe. Drink enough fluid to keep your pee pale yellow. Keep all follow-up visits. Contact a doctor if: You do not get better after 1-2 days. Your symptoms go away and then come back. Get help right away if: You have very bad back pain. You have very bad pain in your lower belly. You have a fever. You have chills. You feeling like you will vomit or you vomit. Summary A urinary tract infection (UTI) is an infection of any part of the urinary tract. This condition is caused by germs in your genital area. There are many risk factors for a UTI. Treatment includes antibiotic medicines. Drink enough fluid to keep your pee pale yellow. This information is not intended to replace advice given to you by your health care provider. Make sure you discuss any questions you have with your health care provider. Document Revised: 07/03/2020 Document Reviewed: 07/08/2020 Elsevier Patient Education    2024 Elsevier Inc.  

## 2023-10-25 LAB — URINE CULTURE

## 2023-12-10 ENCOUNTER — Other Ambulatory Visit (HOSPITAL_COMMUNITY)
Admission: RE | Admit: 2023-12-10 | Discharge: 2023-12-10 | Disposition: A | Discharge status: Home / Self Care | Payer: 59 | Source: Ambulatory Visit | Attending: Obstetrics and Gynecology | Admitting: Obstetrics and Gynecology

## 2023-12-10 ENCOUNTER — Ambulatory Visit: Payer: 59

## 2023-12-10 VITALS — BP 113/79 | HR 82 | Ht 64.0 in | Wt 181.0 lb

## 2023-12-10 DIAGNOSIS — Z23 Encounter for immunization: Secondary | ICD-10-CM

## 2023-12-10 DIAGNOSIS — Z113 Encounter for screening for infections with a predominantly sexual mode of transmission: Secondary | ICD-10-CM

## 2023-12-10 NOTE — Patient Instructions (Signed)
Human Papillomavirus (HPV) Vaccine Injection What is this medication? HUMAN PAPILLOMAVIRUS VACCINE (HYOO muhn pap uh LOH muh vahy ruhs vak SEEN) reduces the risk of human papillomavirus (HPV). It does not treat HPV. It is still possible to get HPV after receiving this vaccine, but the symptoms may be less severe or not last as long. It works by helping your immune system learn how to fight off a future infection. This medicine may be used for other purposes; ask your health care provider or pharmacist if you have questions. COMMON BRAND NAME(S): Gardasil 9 What should I tell my care team before I take this medication? They need to know if you have any of these conditions: Fever Hemophilia HIV or AIDS Immune system problems Infection Low platelets An unusual reaction to human papillomavirus vaccine, yeast, other vaccines, other medications, foods, dyes, or preservatives Pregnant or trying to get pregnant Breastfeeding How should I use this medication? This vaccine is injected into a muscle. It is given by your care team. This vaccine requires 2 or 3 doses to get the full benefit. Set a reminder for when your next dose is due. A copy of the Vaccine Information Statement will be given before each vaccination. Be sure to read this information carefully each time. This sheet may change often. Talk to your care team about the use of this medication in children. While it may be prescribed for children as young as 9 years for selected conditions, precautions do apply. Overdosage: If you think you have taken too much of this medicine contact a poison control center or emergency room at once. NOTE: This medicine is only for you. Do not share this medicine with others. What if I miss a dose? Keep appointments for follow-up doses as directed. It is important not to miss your dose. Call your care team if you are unable to keep an appointment. What may interact with this medication? Certain medications  for arthritis Medications for organ transplant Medications to treat cancer Steroid medications, such as prednisone or cortisone This list may not describe all possible interactions. Give your health care provider a list of all the medicines, herbs, non-prescription drugs, or dietary supplements you use. Also tell them if you smoke, drink alcohol, or use illegal drugs. Some items may interact with your medicine. What should I watch for while using this medication? Visit your care team regularly. Report any side effects to your care team right away. This vaccine, like all vaccines, may not fully protect everyone. What side effects may I notice from receiving this medication? Side effects that you should report to your care team as soon as possible: Allergic reactions--skin rash, itching, hives, swelling of the face, lips, tongue, or throat Feeling faint or lightheaded Side effects that usually do not require medical attention (report these to your care team if they continue or are bothersome): Diarrhea Dizziness Fatigue Fever Headache Nausea Pain, redness, irritation, or bruising at the injection site This list may not describe all possible side effects. Call your doctor for medical advice about side effects. You may report side effects to FDA at 1-800-FDA-1088. Where should I keep my medication? This vaccine is only given by your care team. It will not be stored at home. NOTE: This sheet is a summary. It may not cover all possible information. If you have questions about this medicine, talk to your doctor, pharmacist, or health care provider.  2024 Elsevier/Gold Standard (2022-05-09 00:00:00)

## 2023-12-10 NOTE — Progress Notes (Signed)
    NURSE VISIT NOTE  Subjective:    Patient ID: Michelle Bates, female    DOB: 04/18/1991, 32 y.o.   MRN: [id number]  HPI  Patient is a 32 y.o. G41P2012 female Single African American female who presents for her second Gardasil injection. Order to administer given by Harlene Cisco, CNM on 10/10/23. Patient also requests STI testing. She reports normal vaginal discharge. Denies abnormal vaginal bleeding or significant pelvic pain or fever. . Patient denies history of known exposure to STD. She has had new partners and would like STI testing.    Objective:    BP 113/79   Pulse 82   Ht 5' 4 (1.626 m)   Wt 181 lb (82.1 kg)   LMP 11/20/2023 (Approximate)   BMI 31.07 kg/m   32 y.o. LMP:  11/20/23  Contraception:  Sterilization by Laparoscopy Given by: Camelia Bars, LPN Site:  right deltoid    Assessment:   1. Need for HPV vaccine   2. Routine screening for STI (sexually transmitted infection)      Plan:   Patient will return in 4 months for third injection.  GC and chlamydia DNA  probe sent to lab. Treatment: Pending results. ROV prn if symptoms persist or worsen.   Camelia Bars, LPN

## 2023-12-12 ENCOUNTER — Other Ambulatory Visit: Payer: Self-pay

## 2023-12-12 DIAGNOSIS — B379 Candidiasis, unspecified: Secondary | ICD-10-CM

## 2023-12-12 DIAGNOSIS — N760 Acute vaginitis: Secondary | ICD-10-CM

## 2023-12-12 LAB — CERVICOVAGINAL ANCILLARY ONLY
Bacterial Vaginitis (gardnerella): POSITIVE — AB
Candida Glabrata: NEGATIVE
Candida Vaginitis: POSITIVE — AB
Chlamydia: NEGATIVE
Comment: NEGATIVE
Comment: NEGATIVE
Comment: NEGATIVE
Comment: NEGATIVE
Comment: NEGATIVE
Comment: NORMAL
Neisseria Gonorrhea: NEGATIVE
Trichomonas: NEGATIVE

## 2023-12-12 MED ORDER — FLUCONAZOLE 150 MG PO TABS: 150.0000 mg | ORAL_TABLET | Freq: Every day | ORAL | 0 refills | Status: DC

## 2023-12-12 MED ORDER — METRONIDAZOLE 500 MG PO TABS: 500.0000 mg | ORAL_TABLET | Freq: Two times a day (BID) | ORAL | 0 refills | Status: DC

## 2024-02-07 ENCOUNTER — Encounter: Payer: Self-pay | Admitting: Gastroenterology

## 2024-02-07 ENCOUNTER — Ambulatory Visit: Payer: 59 | Admitting: Gastroenterology

## 2024-02-07 VITALS — BP 118/64 | HR 62 | Ht 64.0 in | Wt 180.0 lb

## 2024-02-07 DIAGNOSIS — Z800 Family history of malignant neoplasm of digestive organs: Secondary | ICD-10-CM | POA: Diagnosis not present

## 2024-02-07 MED ORDER — SUFLAVE 178.7 G PO SOLR: 1.0000 | Freq: Once | ORAL | 0 refills | Status: AC

## 2024-02-07 NOTE — Progress Notes (Signed)
 Chief Complaint: Family history of colon cancer   Referring Provider:     Vena Austria, MD   HPI:     Michelle Bates is a 33 y.o. female referred [facility] for evaluation of  colon cancer screening due to family history.   Mother was diagnosed with colon cancer at age 51.  No previous colonoscopy or other CRC screening.  She is otherwise without any GI symptoms.  Reviewed most recent labs from 08/2023: Normal CBC, CMP, A1c, TSH, iron panel, B12.  No recent abdominal imaging for review.   Past Medical History:  Diagnosis Date   Family history of breast cancer    cancer genetic testing letter sent 6/19   Obesity    Tonsillitis    Vitamin D deficiency      Past Surgical History:  Procedure Laterality Date   TUBAL LIGATION Bilateral 12/04/2018   Procedure: POST PARTUM TUBAL LIGATION;  Surgeon: Conard Novak, MD;  Location: [facility] ORS;  Service: Gynecology;  Laterality: Bilateral;   Family History  Problem Relation Age of Onset   Colon cancer Mother 46   Brain cancer Father 55   Prostate cancer Father 31   Breast cancer Maternal Aunt 58   Social History   Tobacco Use   Smoking status: Never   Smokeless tobacco: Never  Vaping Use   Vaping status: Every Day   Substances: Nicotine  Substance Use Topics   Alcohol use: No   Drug use: No   Current Outpatient Medications  Medication Sig Dispense Refill   Vitamin D, Ergocalciferol, (DRISDOL) 1.25 MG (50000 UNIT) CAPS capsule Take 50,000 Units by mouth once a week.     No current facility-administered medications for this visit.   No Known Allergies   Review of Systems: All systems reviewed and negative except where noted in HPI.     Physical Exam:    Wt Readings from Last 3 Encounters:  02/07/24 180 lb (81.6 kg)  12/10/23 181 lb (82.1 kg)  10/22/23 184 lb (83.5 kg)    BP 118/64   Pulse 62   Ht 5\' 4"  (1.626 m)   Wt 180 lb (81.6 kg)   BMI 30.90 kg/m   Constitutional:  Pleasant, in no acute distress. Psychiatric: Normal mood and affect. Behavior is normal. EENT: Pupils normal.  Conjunctivae are normal. No scleral icterus. Neck supple. No cervical LAD. Cardiovascular: Normal rate, regular rhythm. No edema Pulmonary/chest: Effort normal and breath sounds normal. No wheezing, rales or rhonchi. Abdominal: Soft, nondistended, nontender. Bowel sounds active throughout. There are no masses palpable. No hepatomegaly. Neurological: Alert and oriented to person place and time. Skin: Skin is warm and dry. No rashes noted.   ASSESSMENT AND PLAN;   1) Family history of colon cancer 33 year old female with family history notable for mother with colon cancer diagnosed around age 13.  Recommend early CRC screening due to family history. - Schedule colonoscopy  The indications, risks, and benefits of colonoscopy were explained to the patient in detail. Risks include but are not limited to bleeding, perforation, adverse reaction to medications, and cardiopulmonary compromise. Sequelae include but are not limited to the possibility of surgery, hospitalization, and mortality. The patient verbalized understanding and wished to proceed. All questions answered, referred to the scheduler and bowel prep ordered. Further recommendations pending results of the exam.      Shellia Cleverly, DO, FACG  02/07/2024, 11:32 AM  Vena Austria, MD

## 2024-02-07 NOTE — Patient Instructions (Signed)
 _______________________________________________________  If your blood pressure at your visit was 140/90 or greater, please contact your primary care physician to follow up on this.  If you are age 35 or younger, your body mass index should be between 19-25. Your Body mass index is 30.9 kg/m. If this is out of the aformentioned range listed, please consider follow up with your Primary Care Provider.  ________________________________________________________  The [facility] providers would like to encourage you to use [facility] to communicate with providers for non-urgent requests or questions.  Due to long hold times on the telephone, sending your provider a message by [facility] may be a faster and more efficient way to get a response.  Please allow 48 business hours for a response.  Please remember that this is for non-urgent requests.  _______________________________________________________  Bonita Quin have been scheduled for a colonoscopy. Please follow written instructions given to you at your visit today.   If you use inhalers (even only as needed), please bring them with you on the day of your procedure.  DO NOT TAKE 7 DAYS PRIOR TO TEST- Trulicity (dulaglutide) Ozempic, Wegovy (semaglutide) Mounjaro (tirzepatide) Bydureon Bcise (exanatide extended release)  DO NOT TAKE 1 DAY PRIOR TO YOUR TEST Rybelsus (semaglutide) Adlyxin (lixisenatide) Victoza (liraglutide) Byetta (exanatide) ___________________________________________________________________________  Bonita Quin will receive your bowel preparation through [facility], which ensures the lowest copay and home delivery, with outreach via text or call from an [phone] number. Please respond promptly to avoid rescheduling of your procedure. If you are interested in alternative options or have any questions regarding your prep, please contact them at [phone] ____________________________________________________________________________  Your Provider  Has Sent Your Bowel Prep Regimen To [facility]   [facility] will contact you to verify your information and collect your copay, if applicable. Enjoy the comfort of your home while your prescription is mailed to you, FREE of any shipping charges.   [facility] accepts all major insurance benefits and applies discounts & coupons.  Have additional questions?   Chat: [email or web address] Call: [phone] Email: [email or web address] NCPDP: [id number]  How will [facility] contact you?  With a Welcome phone call,  a Welcome text and a checkout link in text form.  Texts you receive from [phone] Are NOT Spam.  *To set up delivery, you must complete the checkout process via link or speak to one of the patient care representatives. If [facility] is unable to reach you, your prescription may be delayed.  To avoid long hold times on the phone, you may also utilize the secure chat feature on the [facility] website to request that they call you back for transaction completion or to expedite your concerns.  Due to recent changes in healthcare laws, you may see the results of your imaging and laboratory studies on MyChart before your provider has had a chance to review them.  We understand that in some cases there may be results that are confusing or concerning to you. Not all laboratory results come back in the same time frame and the provider may be waiting for multiple results in order to interpret others.  Please give [place] 48 hours in order for your provider to thoroughly review all the results before contacting the office for clarification of your results.   It was a pleasure to see you today!  Vito Cirigliano, D.O.

## 2024-03-13 ENCOUNTER — Encounter: Payer: 59 | Admitting: Gastroenterology

## 2024-04-08 ENCOUNTER — Ambulatory Visit: Payer: 59

## 2024-04-21 ENCOUNTER — Encounter: Admitting: Gastroenterology

## 2024-05-13 ENCOUNTER — Other Ambulatory Visit (HOSPITAL_COMMUNITY)
Admission: RE | Admit: 2024-05-13 | Discharge: 2024-05-13 | Disposition: A | Discharge status: Home / Self Care | Source: Ambulatory Visit | Attending: Licensed Practical Nurse | Admitting: Licensed Practical Nurse

## 2024-05-13 ENCOUNTER — Ambulatory Visit (INDEPENDENT_AMBULATORY_CARE_PROVIDER_SITE_OTHER)

## 2024-05-13 VITALS — BP 115/80 | HR 74 | Ht 64.0 in | Wt 178.6 lb

## 2024-05-13 DIAGNOSIS — Z113 Encounter for screening for infections with a predominantly sexual mode of transmission: Secondary | ICD-10-CM | POA: Insufficient documentation

## 2024-05-13 DIAGNOSIS — N760 Acute vaginitis: Secondary | ICD-10-CM

## 2024-05-13 DIAGNOSIS — Z23 Encounter for immunization: Secondary | ICD-10-CM

## 2024-05-13 DIAGNOSIS — B9689 Other specified bacterial agents as the cause of diseases classified elsewhere: Secondary | ICD-10-CM

## 2024-05-13 NOTE — Progress Notes (Signed)
    NURSE VISIT NOTE  Subjective:    Patient ID: Michelle Bates, female    DOB: 1991/05/10, 33 y.o.   MRN: [id number]  HPI  Patient is a 33 y.o. G33P2012 female Single African American female who presents for her third Gardasil injection. Order to administer given by Quince Bryant, CNM on 10/10/23.  STD Screening requested by patient.    Objective:    BP 115/80   Pulse 74   Ht 5\' 4"  (1.626 m)   Wt 178 lb 9.6 oz (81 kg)   LMP 04/29/2024 (Approximate)   BMI 30.66 kg/m   33 y.o. LMP:  04/29/24  Contraception:  BTL Given by: Woody Heading, CMA Site:  right deltoid     Assessment:   1. Need for HPV vaccine   2. Screen for STD (sexually transmitted disease)      Plan:   Patient will return in prn. Gardasil series complete.   Vale Garrison, CMA

## 2024-05-14 LAB — CERVICOVAGINAL ANCILLARY ONLY
Bacterial Vaginitis (gardnerella): POSITIVE — AB
Candida Glabrata: NEGATIVE
Candida Vaginitis: NEGATIVE
Chlamydia: NEGATIVE
Comment: NEGATIVE
Comment: NEGATIVE
Comment: NEGATIVE
Comment: NEGATIVE
Comment: NEGATIVE
Comment: NORMAL
Neisseria Gonorrhea: NEGATIVE
Trichomonas: NEGATIVE

## 2024-05-15 MED ORDER — METRONIDAZOLE 500 MG PO TABS: 500.0000 mg | ORAL_TABLET | Freq: Two times a day (BID) | ORAL | 0 refills | Status: AC

## 2024-05-15 NOTE — Addendum Note (Signed)
 Addended by: Vale Garrison on: 05/15/2024 09:55 AM   Modules accepted: Orders

## 2024-06-01 NOTE — Telephone Encounter (Signed)
 Patient states she was treated a few weeks ago for BV. She completed the medication. Then she had her cycle which she had already had. She states she is having what she thinks is a return of the BV symptoms. She states she is having low back pain and some mild cramping with some brown discharge, but not sure if that is left over from her cycle. Advised these are menstrual symptoms. She denies any foul odor to discharge. Advised if symptoms return, she can use OTC Boric Acid Suppositories 1-2x/wk or contact us  back with any additional concerns.
